# Patient Record
Sex: Female | Born: 1960
Health system: Southern US, Community
[De-identification: ages and names within clinical notes are randomized; demographics above are authoritative.]

## PROBLEM LIST (undated history)

## (undated) DIAGNOSIS — E119 Type 2 diabetes mellitus without complications: Secondary | ICD-10-CM

## (undated) DIAGNOSIS — C679 Malignant neoplasm of bladder, unspecified: Secondary | ICD-10-CM

## (undated) DIAGNOSIS — M1990 Unspecified osteoarthritis, unspecified site: Secondary | ICD-10-CM

## (undated) DIAGNOSIS — I10 Essential (primary) hypertension: Secondary | ICD-10-CM

## (undated) HISTORY — DX: Unspecified osteoarthritis, unspecified site: M19.90

## (undated) HISTORY — PX: ABDOMINAL HYSTERECTOMY: SHX81

## (undated) HISTORY — DX: Type 2 diabetes mellitus without complications: E11.9

## (undated) HISTORY — DX: Malignant neoplasm of bladder, unspecified: C67.9

## (undated) HISTORY — PX: BLADDER SURGERY: SHX569

## (undated) HISTORY — DX: Essential (primary) hypertension: I10

---

## 1997-10-06 ENCOUNTER — Emergency Department (HOSPITAL_COMMUNITY): Admission: EM | Admit: 1997-10-06 | Discharge: 1997-10-06 | Payer: Self-pay | Admitting: Emergency Medicine

## 1997-10-07 ENCOUNTER — Emergency Department (HOSPITAL_COMMUNITY): Admission: EM | Admit: 1997-10-07 | Discharge: 1997-10-07 | Payer: Self-pay | Admitting: Emergency Medicine

## 2000-10-24 ENCOUNTER — Encounter: Admission: RE | Admit: 2000-10-24 | Discharge: 2000-10-24 | Payer: Self-pay | Admitting: Family Medicine

## 2000-10-24 ENCOUNTER — Encounter: Payer: Self-pay | Admitting: Family Medicine

## 2000-12-29 ENCOUNTER — Encounter (INDEPENDENT_AMBULATORY_CARE_PROVIDER_SITE_OTHER): Payer: Self-pay

## 2000-12-29 ENCOUNTER — Ambulatory Visit (HOSPITAL_COMMUNITY): Admission: RE | Admit: 2000-12-29 | Discharge: 2000-12-29 | Payer: Self-pay | Admitting: *Deleted

## 2001-04-09 ENCOUNTER — Emergency Department (HOSPITAL_COMMUNITY): Admission: EM | Admit: 2001-04-09 | Discharge: 2001-04-09 | Payer: Self-pay | Admitting: Emergency Medicine

## 2001-04-09 ENCOUNTER — Encounter: Payer: Self-pay | Admitting: Emergency Medicine

## 2001-07-21 ENCOUNTER — Encounter (INDEPENDENT_AMBULATORY_CARE_PROVIDER_SITE_OTHER): Payer: Self-pay | Admitting: Specialist

## 2001-07-21 ENCOUNTER — Observation Stay (HOSPITAL_COMMUNITY): Admission: RE | Admit: 2001-07-21 | Discharge: 2001-07-22 | Payer: Self-pay | Admitting: *Deleted

## 2002-12-23 ENCOUNTER — Ambulatory Visit (HOSPITAL_COMMUNITY): Admission: RE | Admit: 2002-12-23 | Discharge: 2002-12-23 | Payer: Self-pay | Admitting: Family Medicine

## 2003-05-12 ENCOUNTER — Encounter: Admission: RE | Admit: 2003-05-12 | Discharge: 2003-05-12 | Payer: Self-pay | Admitting: Family Medicine

## 2006-02-10 ENCOUNTER — Emergency Department (HOSPITAL_COMMUNITY): Admission: EM | Admit: 2006-02-10 | Discharge: 2006-02-11 | Payer: Self-pay | Admitting: Emergency Medicine

## 2007-01-11 ENCOUNTER — Emergency Department (HOSPITAL_COMMUNITY): Admission: EM | Admit: 2007-01-11 | Discharge: 2007-01-11 | Payer: Self-pay | Admitting: Emergency Medicine

## 2009-07-18 ENCOUNTER — Encounter (HOSPITAL_BASED_OUTPATIENT_CLINIC_OR_DEPARTMENT_OTHER): Admission: RE | Admit: 2009-07-18 | Discharge: 2009-10-16 | Payer: Self-pay | Admitting: General Surgery

## 2010-03-28 ENCOUNTER — Encounter (HOSPITAL_BASED_OUTPATIENT_CLINIC_OR_DEPARTMENT_OTHER)
Admission: RE | Admit: 2010-03-28 | Discharge: 2010-04-17 | Payer: Self-pay | Source: Home / Self Care | Attending: General Surgery | Admitting: General Surgery

## 2010-04-04 ENCOUNTER — Ambulatory Visit: Admit: 2010-04-04 | Payer: Self-pay | Admitting: Vascular Surgery

## 2010-04-04 ENCOUNTER — Ambulatory Visit
Admission: RE | Admit: 2010-04-04 | Discharge: 2010-04-04 | Payer: Self-pay | Source: Home / Self Care | Attending: Vascular Surgery | Admitting: Vascular Surgery

## 2010-05-14 ENCOUNTER — Encounter (INDEPENDENT_AMBULATORY_CARE_PROVIDER_SITE_OTHER): Payer: Medicare HMO | Admitting: Vascular Surgery

## 2010-05-14 DIAGNOSIS — I83229 Varicose veins of left lower extremity with both ulcer of unspecified site and inflammation: Secondary | ICD-10-CM

## 2010-05-14 DIAGNOSIS — L97919 Non-pressure chronic ulcer of unspecified part of right lower leg with unspecified severity: Secondary | ICD-10-CM

## 2010-05-15 NOTE — Consult Note (Signed)
NEW PATIENT CONSULTATION  [name], [name] DOB:  08-28-1960                                       05/14/2010 CHART#:[id number]  This patient is a 50 year old female with a history of venous insufficiency and stasis ulcer in the left leg for the past 6 months and symptoms of venous insufficiency in the right leg for the last several months.  She has had some bulging varicosities in both legs for many years and 6 months ago developed spontaneously an ulcer in the medial aspect of the left ankle.  She was treated at [facility] and has worn short-leg elastic compression stockings (20 mm - 30 mm gradient) prescribed by Dr. Leonie Man.  She eventually quit going to the wound center treated and treated the ulcer herself and it healed a few months ago.  She has had darkening and scaliness of the skin in the left ankle for the last few years as well as chronic edema with left leg worse than the right but has cramping and aching discomfort in both the legs as the day progresses with burning and itching.  She has no history of thrombophlebitis, deep vein thrombosis, bleeding or other complications other than the stasis ulcer.  She has had increasing discomfort, however.  She does not elevate her legs or take pain medicine on a regular basis.  CHRONIC MEDICAL PROBLEMS: 1. Hyperlipidemia. 2. Negative for diabetes, hypertension, coronary artery disease, COPD     or stroke.  SOCIAL HISTORY:  She is married and has one child.  She is a Water quality scientist.  She has not smoked since 2003.  Does not use alcohol.  FAMILY HISTORY:  Positive for stroke in her mother and negative for coronary artery disease or diabetes.  REVIEW OF SYSTEMS:  Positive for spasms in her legs, but no claudication.  Denies all other systems in a complete  review of systems.  PHYSICAL EXAMINATION:  Blood pressure 143/90, heart rate 83, respirations 18.  General:  She is a well-developed,  well-nourished female in no apparent distress, alert and oriented x3.  HEENT:  Exam is normal for age.  EOMs intact.  Lungs:  Clear to auscultation.  No rhonchi or wheezing.  Cardiovascular:  Regular rhythm.  No murmurs. Carotid pulses 3+.  No audible bruits.  Abdomen:  Soft, nontender with no masses.  Musculoskeletal:  Exam is free of major deformities. Neurologic:  Exam is normal.  Lower extremity exam:  Reveals 3+ femoral and posterior tibial pulses bilaterally.  There are bulging varicosities below the knee in both great saphenous systems, left worse than the right.  There is 1+ edema bilaterally.  Left ankle has hyperpigmentation with lipodermatosclerosis particularly medially with a healed stasis ulcer with the scar  measuring about 1.5 in diameter.  No active ulcers noted in the right leg.  I reviewed her venous duplex exam which was performed in our office on 04/04/2010 which reveals severe reflux in the left great saphenous system from the saphenofemoral junction to the knee with no evidence of DVT.  Right leg has incompetence of the great saphenous vein below the knee but not in the thigh or at the junction.  I think this patient does need laser ablation of her left great saphenous vein to treat the stasis ulcer and skin changes which she has developed and may well develop problems in the  contralateral right leg as time progresses.  She is having symptoms in the right leg.  We will treat her with long-leg elastic compression stockings (20 mm - 30 mm gradient) as well as elevation and ibuprofen.  She will return in 3 months and at that time I think we should consider scheduling her for laser ablation of the left great saphenous vein.    [name], M.D. Electronically Signed  JDL/MEDQ  D:  05/14/2010  T:  05/15/2010  Job:  [id number]

## 2010-07-31 NOTE — Procedures (Signed)
LOWER EXTREMITY VENOUS REFLUX EXAM   INDICATION:  Left lower extremity ulcer.   EXAM:  Using color-flow imaging and pulse Doppler spectral analysis, the  bilateral common femoral, superficial femoral, popliteal, posterior  tibial, greater and lesser saphenous veins are evaluated.  There is  evidence suggesting physiologic deep venous insufficiency in the common  femoral vein of the lower extremity.   The left saphenofemoral junction is not competent with Reflux of  >512milliseconds. The left GSV is not competent throughout its course  with reflux of >566milliseconds with the caliber as described below.  The right GSV is not competent below the knee.   The bilateral proximal short saphenous veins demonstrate competency.   GSV Diameter (used if found to be incompetent only)                                            Right         Left  Proximal Greater Saphenous Vein           0.64 cm       0.60 cm  Proximal-to-mid-thigh                     cm            0.68 cm  Mid thigh                                 cm            0.63 cm  Mid-distal thigh                          cm            cm  Distal thigh                              cm            0.86 cm  Knee                                      cm            0.50 cm   IMPRESSION:  1. The left greater saphenous vein is not competent throughout its      course with branches and varicose vein nests in the calf and ankle.  2. The right greater saphenous vein is not competent in the calf.  3. The bilateral greater saphenous veins are not tortuous.  4. The deep venous system is competent.  5. The bilateral short saphenous vein is competent.  6. No evidence of deep venous or superficial femoral thrombus in the      bilateral lower extremities.   ___________________________________________  [name], M.D.   LT/MEDQ  D:  04/04/2010  T:  04/04/2010  Job:  [id number]

## 2010-08-03 NOTE — H&P (Signed)
[facility]  PatientMERCI, [name] Visit Number: [id number] MRN: [id number]          Service Type: Attending:  Marina Gravel, M.D. Dictated by:   Marina Gravel, M.D. Adm. Date:  12/29/00                           History and Physical  SOCIAL SECURITY NUMBER:       [id number]  DATE OF BIRTH:                March 09, 1961  REFERRING PHYSICIAN:          Redmond Baseman, M.D.  PREOPERATIVE DIAGNOSIS:       Complex left ovarian cyst.  INTENDED PROCEDURE:           Diagnostic laparoscopy, left salpingo-oophorectomy versus left ovarian cystectomy.  HISTORY OF PRESENT ILLNESS:   Patient is a 50 year old [place] female gravida 1 para 1 seen at the request of Dr. Leodis Sias for evaluation of vaginal pressure.  Subsequent ultrasound showed a left complex ovarian cyst.  This was initially noted in August.  Repeated one month later and persistent cyst noted.  The ultrasound appearance is of a ground-glass-type appearance consistent with endometrioma.  Patient with history of difficulty achieving pregnancy.  She presents for definitive diagnosis and treatment.  PAST MEDICAL HISTORY:         None.  PAST SURGICAL HISTORY:        None.  PAST OBSTETRICAL HISTORY:     Spontaneous vaginal delivery x 1.  MEDICATIONS:                  None.  ALLERGIES:                    None.  SOCIAL HISTORY:               One pack-a-day smoker.  No alcohol or other drugs.  FAMILY HISTORY:               Negative.  REVIEW OF SYSTEMS:            CONSTITUTIONAL:  No unexplained weight change, fever, dizzy spells, or fainting.  EYES:  No trouble with vision.  ENT:  No trouble with ears, hearing, nose bleeds, or sinuses.  CARDIOVASCULAR:  No chest pain or shortness of breath.  RESPIRATORY:  No cough.  GASTROINTESTINAL: No nausea or vomiting, blood in stools, heartburn, indigestion, or constipation.  GENITOURINARY:  No abnormal bleeding, vaginal  discharge, leakage of urine, or painful urination.  MUSCULOSKELETAL:  No joint or muscle pain.  SKIN AND BREAST:  No lesions.  NEUROLOGIC:  No headaches or dizzy spells.  PSYCHIATRIC:  No work or family problems, history of domestic violence or sexual assault.  ENDOCRINE:  No hot flashes, thyroid disease, or diabetes.  HEMATOLOGIC:  No unexplained bruising or bleeding.  PHYSICAL EXAMINATION:  VITAL SIGNS:                  Blood pressure 120/76, weight 225, height 5 feet 6 inches.  GENERAL:                      Patient is a well-developed, mildly obese female in no acute distress.  NECK:                         Supple,  no thyromegaly.  LUNGS:                        Clear to auscultation.  HEART:                        Regular rate and rhythm.  ABDOMEN:                      Liver and spleen normal, no hernia.  No mass palpable.  LYMPHATIC:                    Lymph node survey negative of neck, axilla, and groin.  SKIN:                         No lesions.  BREAST:                       No dominant breast mass, nipple discharge, or adenopathy.  PEVIC/RECTAL:                 Normal external female genitalia, vagina and cervix normal.  Pap smear performed and was normal August 2002.  Uterus normal size, difficult to palpate due to body habitus.  Adnexal mass difficult to palpate due to body habitus.  Urethra, bladder base, anus, perineum normal. Rectovaginal exam confirms.  Heme negative.  LABORATORY DATA:              Ultrasound shows a left ovarian 5 cm cyst with a ground-glass appearance.  No increased blood flow with colored Doppler.   In my opinion appears consistent with an endometrioma.  Also noted incidentally are two fibroids - one anterior and one posterior, both approximately 1.5 cm in maximum diameter.  ASSESSMENT:                   Persistent complex left ovarian cyst, probable endometrioma by ultrasound appearance.  PLAN:                         Diagnostic  laparoscopy, open technique. Probable left ovarian cystectomy; however, may need left salpingo-oophorectomy.  Discussed with the patient potential need for frozen section analysis and staging if indicated.  Operative risks discussed including infection; bleeding; damage to bowel, bladder, or surrounding organs. All questions answered.  Patient wishes to proceed.  Arrangements have been made for December 29, 2000 at [facility].  A bowel prep will be given preoperatively. Dictated by:   Marina Gravel, M.D. Attending:  Marina Gravel, M.D. DD:  12/18/00 TD:  12/18/00 Job: [id number]

## 2010-08-03 NOTE — Discharge Summary (Signed)
[facility]  PatientTHANVI, [name] Visit Number: [id number] MRN: [id number]          Service Type: DSU Location: 9300 [id number] Attending Physician:  Ermalene Searing Dictated by:   Marina Gravel, M.D. Admit Date:  07/21/2001 Discharge Date: 07/22/2001                             Discharge Summary  PREOPERATIVE DIAGNOSES:       1. Menorrhagia.                               2. Uterine fibroids.                               3. Endometriosis.  POSTOPERATIVE DIAGNOSES:      1. Menorrhagia.                               2. Uterine fibroids.                               3. Endometriosis.  PROCEDURES:                   Laparoscopic-assisted vaginal hysterectomy with bilateral salpingo-oophorectomy.  HISTORY OF PRESENT ILLNESS:   For complete details, please see the H&P in the chart.  Briefly, the patient is a 50 year old [place] female, gravida 1, para 1, who presents for definitive surgical management of the above symptoms.  HOSPITAL COURSE:              On day of admission, the patient underwent LAVH and BSO without complication.  Findings at time of surgery included endometriosis on left pelvic sidewall (about 0.5 cm implant directly over the course of left ureter which was left untreated given its location).  Also slightly enlarged uterus consistent with fibroids and findings on left ovary consistent with endometriosis.  Postoperatively, the patient rapidly regained the ability to ambulate, void, and tolerate a regular diet.  She was discharged home on the first postoperative day in satisfactory condition.  DISCHARGE INSTRUCTIONS:       1. No heavy lifting and nothing in the                                  vagina for six weeks.  No driving for two                                  weeks.                               2. Notify with fever, increasing vaginal                                  bleeding or discharge, pain, or other                        concerns.  DISCHARGE MEDICATIONS:  1. Climara patch 0.05 mg daily, change weekly.                               2. Tylox 1 to 2 p.o. q.4-6h. p.r.n. pain, #30,                                  no refill.  [facility] Dr. Earlene Plater, in four weeks.  CONDITION UPON DISCHARGE:     Satisfactory. Dictated by:   Marina Gravel, M.D. Attending Physician:  Marina Gravel B DD:  07/22/01 TD:  07/24/01 Job: [id number]

## 2010-08-03 NOTE — Op Note (Signed)
[facility]  PatientJENYA, [name] Visit Number: [id number] MRN: [id number]          Service Type: DSU Location: 9300 [id number] Attending Physician:  Ermalene Searing Dictated by:   Marina Gravel, M.D. Proc. Date: 07/21/01 Admit Date:  07/21/2001                             Operative Report  PREOPERATIVE DIAGNOSIS:       Menorrhagia, uterine fibroids, endometriosis.  POSTOPERATIVE DIAGNOSIS:      Menorrhagia, uterine fibroids, endometriosis.  OPERATION:                    LAVH/BSO.  SURGEON:                      Marina Gravel, M.D.  ASSISTANT:                    Lenoard Aden, M.D.  ANESTHESIA:                   General.  FINDINGS:                     Endometriosis, left pelvic side wall.  Normal appearing tubes and ovaries.  Small uterine fibroids noted.  ESTIMATED BLOOD LOSS:         300 cc.  URINE:                        400 cc.  FLUID:                        1700 cc.  DRAINS:                       Foley, vaginal pack in the vagina.  INDICATIONS:                  Patient with a history of menorrhagia not responding to medical management.  Ultrasound suggested uterine fibroids and preoperative endometrial biopsy showed secretory endometrium.  Also with a known history of endometriosis and desires definitive therapy in this regard as well.  DESCRIPTION OF PROCEDURE:     The patient taken to the operating room, given general anesthesia.  She was placed in the ski position and examined under anesthesia.  She was found to have a midposition slightly enlarged uterus and adnexal masses.  She was prepped and draped in the standard fashion and a Foley catheter was inserted into the bladder.  Attention was then turned to the umbilicus.  A 10 mm vertical infraumbilical skin fold incision was made with the knife.  This was carried sharply to the underlying fascia.  The fascia was divided sharply with the knife and elevated with Kocher  clamps.  The posterior sheath and peritoneum were then divided sharply.  A purse string suture of 0 Vicryl was placed around the facial defect, Hasson cannula inserted and secured.  Pneumoperitoneum was obtained with CO2 gas.  The patient was placed in Trendelenburg position.  The inferior ports were placed 8 cm off the midline, 2 cm above the symphysis.  Each was a 5 mm port and each was placed under direct laparoscopic visualization.  The pelvis was inspected with the above findings noted.  The uterus was placed on traction and deviated to  the patients right, and the left tube and ovary grasped and elevated.  The course of the ureter was identified and found to be well away from IP ligament.  Subsequently, the IP ligament was triple burned with bipolar cautery and divided.  The peritoneum between the tube and round ligament was serially cauterized and divided with bipolar cautery.  The round ligament was then cauterized in triple burn fashion with bipolar cautery and divided sharply.  This procedure was repeated entirely on the opposite side in similar fashion.  There was an endometriotic implant directly over the course of the ureter.  It was about 5 mm in diameter.  Given its direct location over the ureter it was left untreated. The bladder flap was then created with sharp and blunt technique.  Attention was turned to the vagina after pneumoperitoneum was released.  A wedge speculum was placed in the vagina and the cervix grasped anteriorly and posteriorly with Loman [place].  The posterior cul-de-sac was entered sharply.  The uterosacral ligaments were clamped on each side with curved Haney clamp, divided sharply, and suture ligated with 0 Vicryl, with hemostasis obtained.  The remained of the vagina was circumscribed with Bovie cautery and the anterior cul-de-sac entered sharply.  A curved retractor was placed into the anterior cul-de-sac and a long wedge speculum placed into  the posterior cul-de-sac.  The cardinal ligaments were then serially clamped with the bipolar clamping device and cauterized, and subsequently divided sharply. This was repeated on each side in a similar fashion to the level of the dissection from above.  This allowed removal of the uterus.  Prior to the last clamp, the uterus was circumscribed with a Bovie about 50% of its thickness into the myometrium to allow decompression of the uterine fibroids and easier passage of the uterus into the vagina and to allow for the final clamp on each side, which was subsequently clamped, cauterized, and divided sharply.  A tipped sponge was placed into the abdomen vaginally to protect the colon and the vagina closed with interrupted figure-of-eight suture of 0 Vicryl.  The sponge was removed prior to the final suture.  Also prior to the final suture a McCall culdoplasty suture was placed with 0 Vicryl and fixed to both uterosacral ligaments.  The vaginal cuff was also fixed to the uterosacral ligament with the closing suture at that level.  After the vagina was closed, the McCall suture was cinched down and this provided good obliteration of the posterior cul-de-sac.  A vaginal pack was placed and gloves changed, and attention returned to the abdomen.  Pneumoperitoneum was reobtained and the pelvis inspected.  There were a few bleeders along the vaginal cuff that were made hemostatic with the bipolar cautery.  The pelvis was irrigated, reinspected, and hemostasis found.  Therefore, the procedure was terminated.  The inferior ports were removed under direct laparoscopic visualization and the sites were hemostatic.  The scope was removed, Hasson cannula removed, gas released, and I inserted my index finger through the fascial defect and stitched down the pursestring suture.  This obliterated the fascial defect and prevented herniation of abdominal contents due to defect prior to closure.  Deep sutures  were placed of 2-0 Vicryl in the subcutaneous tissue at the  umbilical incision, and the skin each site closed with Dermabond.  The patient tolerated the procedure well and there were no complications.  She was taken to the recovery room awake and alert in stable condition.  All counts were correct. Dictated  by:   Marina Gravel, M.D. Attending Physician:  Marina Gravel B DD:  07/21/01 TD:  07/21/01 Job: [id number]

## 2010-08-03 NOTE — H&P (Signed)
[facility]  PatientKEAMBER, MACFADDEN Visit Number: [id number] MRN: [id number]          Service Type: DSU Location: 9300 [id number] Attending Physician:  Ermalene Searing Dictated by:   Marina Gravel, M.D. Admit Date:  07/21/2001                           History and Physical  CHIEF COMPLAINT:              Abnormal uterine bleeding not responding to                               medical management, uterine fibroids,                               endometriosis.  INTENDED PROCEDURE:           Open laparoscopy with laparoscopically assisted                               vaginal hysterectomy, bilateral                               salpingo-oophorectomy.  HISTORY OF PRESENT ILLNESS:   This 50 year old [place] female, gravida 1 para 1, presents for definite surgical management of menorrhagia and uterine fibroids, also with a history of endometriosis with associated pelvic pain. Recent endometrial biopsy has shown benign secretory endometrium.  Bleeding has not responded to high-dose ______.  The patient is not a birth control pill candidate as she smokes.  PAST MEDICAL HISTORY:         1. Upper laparoscopy.                               2. Left ovarian cystectomy for endometrioma.                                  Small amount of endometriosis otherwise                                  noted throughout the pelvis.   MEDICATIONS:                  None.  ALLERGIES:                    None.  SOCIAL HISTORY:               One-pack-a-day smoker.  No alcohol or other drugs.  FAMILY HISTORY:               Noncontributory.  REVIEW OF SYSTEMS:            Otherwise negative.  PHYSICAL EXAMINATION:  VITAL SIGNS:                  Blood pressure 108/70.  Weight 242 pounds. Pulse 84.  GENERAL:                      Alert and oriented, in no  acute distress.  SKIN:                         Warm and dry, no lesions.  HEART:                        Regular  rate and rhythm.  LUNGS:                        Clear to auscultation.  ABDOMEN:                      Obese.  Liver and spleen normal.  No hernia. Umbilical incision from previous laparoscopy noted.  PELVIC:                       Normal external genitalia.  Vagina normal. Cervix parous.  Uterus slightly enlarged consistent with small fibroids; however, it is mobile with Valsalva.  LABORATORY DATA:              Recent ultrasound shows two 2 cm fibroids, otherwise no abnormalities.  ASSESSMENT:                   1. Abnormal bleeding, primarily menorrhagia, not                                  responding to medical management.  Benign                                  preoperative biopsy and small fibroids on                                  ultrasound.                               2. History of endometriosis with previous                                  surgical treatment for endometrioma.  PLAN:                         Laparoscopically assisted vaginal hysterectomy and bilateral salpingo-oophorectomy.  Operative risks were discussed including infection, bleeding, damage to bowel or bladder or surrounding organs.  All questions were answered and the patient wished to proceed. Dictated by:   Marina Gravel, M.D. Attending Physician:  Marina Gravel B DD:  07/20/01 TD:  07/21/01 Job: [id number]

## 2010-08-03 NOTE — Op Note (Signed)
[facility]  PatientELY, SPRAGG Visit Number: [id number] MRN: [id number]          Service Type: DSU Location: [facility] Attending Physician:  Ermalene Searing Dictated by:   Marina Gravel, M.D. Proc. Date: 12/29/00 Admit Date:  12/29/2000                             Operative Report  PREOPERATIVE DIAGNOSES:       Persistent complex left ovarian cyst, probable endometrioma.  POSTOPERATIVE DIAGNOSES:      Left endometrioma.  PROCEDURE:                    Open laparoscopy, left ovarian cystectomy, placement of ______ in the pelvis after procedure.  SURGEON:                      Marina Gravel, M.D.  ASSISTANT:                    Sung Amabile. Roslyn Smiling, M.D.  ANESTHESIA:                   General.  FINDINGS:                     Left endometrioma, superficial ("brown spotting") endometriosis lesions of the bladder flap, cul-de-sac, and right ovary.  SPECIMENS:                    Left ovarian cyst.  ESTIMATED BLOOD LOSS:         50 cc.  COMPLICATIONS:                None.  DISPOSITION:                  Recovery room, stable.  INDICATIONS:                  Patient with persistent left ovarian cyst and a sensation of pelvic fullness, history of difficulty achieving pregnancy. Patient presents for definitive diagnosis and treatment.  PROCEDURE:                    Patient was taken to the operating room and general anesthesia obtained.  She was placed in the ski position and examined under anesthesia.  She was noted to have a left adnexal mass palpable.  No other abnormalities were noted.  She was then prepped and draped in the standard fashion and a Foley catheter inserted into the bladder.  Speculum was inserted into the vagina and the anterior lip of the cervix grasped with a tooth tenaculum.  A Hulka tenaculum was then inserted into the uterine cavity and attached to the anterior lip of the cervix.  All other instruments were then removed from  the vagina.  Attention was then turned to the abdomen.  A 10 mm vertical infraumbilical skin fold incision was made with a knife.  Subcutaneous tissue was then dissected away bluntly and the underlying fascia was divided sharply with the knife.  The fascia was then elevated with Kocher clamps and the posterior sheath and peritoneum were incised sharply with the knife.  A purse string suture was then placed with 0 Vicryl around the fascial defect. Hasson cannula was then inserted through the defect and secured with a purse string suture.  Intra-abdominal placement was confirmed with the laparoscope. Pneumoperitoneum  obtained with CO2 gas.  Patient was placed in Trendelenburg position and the pelvis inspected with the above findings noted.  Ancillary trocars were inserted in the left lower quadrant and right lower quadrant and midline.  Each was a 5 mm trocar.  Each was placed approximately 2 cm above the symphysis and each was placed under direct laparoscopic visualization.  The left ovarian cyst was then mobilized away from the pelvic side wall where it was adhesed.  Also, filmy adhesions were dissected sharply away from the sigmoid colon.  During mobilization of the cyst the cyst wall ruptured and chocolate fluid was released.  The pelvis was irrigated and the cyst again inspected.  The cyst wall was then further opened with the hook scissors.  The cyst wall was then grasped and dissected away bluntly from the ovary.  The ovarian cyst wall was sent as a specimen to pathology.  The bed of the previous ovarian cyst and the cut edge of the ovary were then made hemostatic with bipolar cautery.  Hemostasis was obtained.  The remainder of the endometriosis lesions were diffuse and very superficial.  Therefore, I made the decision that further treatment would not be of benefit at this time. It is my opinion the patient may be a good candidate for Depo-Lupron should she have recurrent  pelvic pain.  The left lower quadrant trocar was removed and the site was hemostatic.  The ______ was then placed through the incision by placing the introducer tube through the previous left lower quadrant incision.  The pelvis was then filled with one container of ______ for reduction of postoperative adhesion formation.  The other inferior ports were then removed under laparoscopic visualization. Each site was hemostatic.  The scope was removed and gas released.  Hasson cannula was removed and I inserted an index finger through the fascial defect and elevated the abdominal wall.  As I cinched down the purse string suture I removed the index finger.  The fascial defect was completely closed and no intra-abdominal contents herniated through the defect.  Each skin incision was then closed with subcuticular 4-0 Vicryl.  The patient tolerated procedure well and there were no complications.  She was taken to the recovery room awake, alert, and in stable condition.  All counts were correct per the operating room staff.Dictated by:   Marina Gravel, M.D.  Attending Physician:  Marina Gravel B DD:  12/29/00 TD:  12/29/00 Job: [id number]

## 2010-08-27 ENCOUNTER — Ambulatory Visit (INDEPENDENT_AMBULATORY_CARE_PROVIDER_SITE_OTHER): Payer: Medicare HMO | Admitting: Vascular Surgery

## 2010-08-27 DIAGNOSIS — I83893 Varicose veins of bilateral lower extremities with other complications: Secondary | ICD-10-CM

## 2010-08-28 NOTE — Assessment & Plan Note (Signed)
OFFICE VISIT  [name], [name] DOB:  Feb 06, 1961                                       08/27/2010 [id number]#:[id number]  The patient returns today for continued followup regarding her severe venous insufficiency of the left lower extremity with a history of venous stasis ulcer which healed about 6 months ago.  She has been having aching, throbbing, burning and cramping discomfort as well as itching around the area where the ulcer healed.  She has worn long leg pressure stockings (20-30 mm gradient) and has tried elevation and ibuprofen over the last 3 months.  She has not had a recurrence of the ulcer but continues to have severe symptoms with chronic swelling in the left leg despite this treatment.  It is definitely effecting her daily living at this time.  PHYSICAL EXAMINATION:  Vital signs:  Today blood pressure is 148/78, heart rate is 93, respirations 16.  General:  She is a well-developed, well-nourished female in no apparent distress, alert and oriented x3. HEENT:  Normal for age.  EOMs intact.  Lungs:  Clear to auscultation. No rhonchi or wheezing.  Lower extremities:  Exam reveals 3+ femoral, popliteal and dorsalis pedis pulse bilaterally.  Left leg has chronic 1+ edema with some early hyperpigmentation.  There is evidence of a healed ulcer adjacent to the medial malleolus on the left ankle with very dark crusty skin surrounding this over a 3-4 cm circumference with a punctate healed ulcer in the center.  She has 3+ arterial pulses distally.  She also has a few bulging varicosities over the great saphenous system in the calf area.  She does have documented gross reflux in the left great saphenous system from the saphenofemoral junction to the knee causing her venous hypertension.  I think the best plan would be to proceed with laser ablation of left great saphenous vein which we will perform in the near future following precertification from her  insurance company.    [name], M.D. Electronically Signed  JDL/MEDQ  D:  08/27/2010  T:  08/28/2010  Job:  [id number]

## 2010-09-17 ENCOUNTER — Other Ambulatory Visit (INDEPENDENT_AMBULATORY_CARE_PROVIDER_SITE_OTHER): Payer: 59 | Admitting: Vascular Surgery

## 2010-09-17 DIAGNOSIS — I83893 Varicose veins of bilateral lower extremities with other complications: Secondary | ICD-10-CM

## 2010-09-18 NOTE — Assessment & Plan Note (Signed)
OFFICE VISIT  CHRISA, HASSAN DOB:  06-26-1960                                       09/17/2010 [id number]#:[id number]  The patient had laser ablation of her left great saphenous vein done under local tumescent anesthesia for severe venous hypertension in the left great saphenous system secondary to valvular incompetence with resultant history of stasis ulcer in the left ankle.  She tolerated the procedure well.  Will return in 1 week for followup venous duplex exam to confirm closure.    [name], M.D. Electronically Signed  JDL/MEDQ  D:  09/17/2010  T:  09/18/2010  Job:  [id number]

## 2010-09-25 ENCOUNTER — Ambulatory Visit (INDEPENDENT_AMBULATORY_CARE_PROVIDER_SITE_OTHER): Payer: 59 | Admitting: Vascular Surgery

## 2010-09-25 ENCOUNTER — Encounter (INDEPENDENT_AMBULATORY_CARE_PROVIDER_SITE_OTHER): Payer: 59

## 2010-09-25 DIAGNOSIS — M79609 Pain in unspecified limb: Secondary | ICD-10-CM

## 2010-09-25 DIAGNOSIS — Z48812 Encounter for surgical aftercare following surgery on the circulatory system: Secondary | ICD-10-CM

## 2010-09-25 DIAGNOSIS — I83893 Varicose veins of bilateral lower extremities with other complications: Secondary | ICD-10-CM

## 2010-09-25 NOTE — Assessment & Plan Note (Signed)
OFFICE VISIT  [name], [name] DOB:  06-12-1960                                       09/25/2010 [id number]#:[id number]  Patient had laser ablation of her left great saphenous vein for venous hypertension and history of venous stasis ulcer secondary to valvular incompetence of the left great saphenous vein.  She tolerated the procedure well and had a moderate amount of discomfort from the knee to the groin overlying the saphenous vein with some swelling around the vein.  She has had no distal edema and no pain at the entrance site in the left calf.  She denies any chest pain, dyspnea on exertion, hemoptysis, or other symptoms since the procedure.  CHRONIC MEDICAL PROBLEMS WHICH ARE STABLE: 1. Hyperlipidemia. 2. Negative for diabetes, hypertension, coronary artery disease, COPD     or stroke.  PHYSICAL EXAMINATION:  Blood pressure is 141/76, heart rate 88, respirations 24.  Generally, a well-developed, well-nourished female in no apparent distress.  Lungs:  Clear to auscultation.  No rhonchi or wheezing.  Lower extremity exam reveals mild to moderate tenderness over the great saphenous vein from the proximal calf to the saphenofemoral junction with some mild erythema.  I am able to straighten the leg completely, and she has 3+ dorsalis pedis pulse.  No distal edema.  No active ulcers.  Today I ordered a venous duplex exam which I have reviewed and interpreted.  There is no DVT, and the left great saphenous vein is totally ablated from the entrance site in the proximal calf to the saphenofemoral junction.  I reassured her regarding these findings.  Suggested she wear the long stocking 1 more week and then convert to a left leg short stocking on a chronic basis because of her skin changes.  She does have varicosities in the great saphenous system below the knee on the right side but no reflux from the junction on the right down to the knee.  If these  should progress and become symptomatic with time, she will be in touch with [place] to repeat a duplex exam of the right leg.    [name], M.D. Electronically Signed  JDL/MEDQ  D:  09/25/2010  T:  09/25/2010  Job:  [id number]

## 2010-10-03 NOTE — Procedures (Unsigned)
DUPLEX DEEP VENOUS EXAM - LOWER EXTREMITY  INDICATION:  Left lower extremity pain and followup of endovenous laser ablation.  HISTORY:  Edema:  Yes. Trauma/Surgery:  Endovenous laser ablation on the left lower extremity 09/17/2010. Pain:  Yes. PE:  No. Previous DVT:  No. Anticoagulants:  No. Other:  Left medial malleolus healing ulcer.  DUPLEX EXAM:               CFV   SFV   PopV  PTV    GSV               R  L  R  L  R  L  R   L  R  L Thrombosis    o  o     o     o      o     + Spontaneous   +  +     +     +      +     o Phasic        +  +     +     +      +     o Augmentation  +  +     +     +      +     o Compressible  +  +     +     +      +     o Competent     +  +     +     +            o  Legend:  + - yes  o - no  p - partial  D - decreased  IMPRESSION: 1. No evidence of deep venous thrombosis identified in the left lower     extremity. 2. Good post ablation result the length of the left GSV ablated. 3. No extension of thrombus identified at the left saphenofemoral     junction. 4. Patent contralateral common femoral vein with normal spontaneous     and phasic flow.    _____________________________ [name], M.D.  SH/MEDQ  D:  09/25/2010  T:  09/25/2010  Job:  [id number]

## 2010-11-06 ENCOUNTER — Other Ambulatory Visit: Payer: Self-pay | Admitting: Infectious Diseases

## 2010-11-06 ENCOUNTER — Ambulatory Visit
Admission: RE | Admit: 2010-11-06 | Discharge: 2010-11-06 | Disposition: A | Discharge status: Home / Self Care | Payer: No Typology Code available for payment source | Source: Ambulatory Visit | Attending: Infectious Diseases | Admitting: Infectious Diseases

## 2010-11-06 DIAGNOSIS — R7611 Nonspecific reaction to tuberculin skin test without active tuberculosis: Secondary | ICD-10-CM

## 2010-12-26 LAB — CBC
HCT: 42.9
Hemoglobin: 15
MCHC: 35.1
MCV: 89.1
Platelets: 196
RBC: 4.82
RDW: 12.1
WBC: 10.5

## 2010-12-26 LAB — POCT CARDIAC MARKERS
Operator id: 4531
Troponin i, poc: <0.05

## 2010-12-26 LAB — DIFFERENTIAL
Basophils Absolute: 0
Basophils Relative: 0
Eosinophils Absolute: 0.1
Eosinophils Relative: 1
Lymphocytes Relative: 22
Lymphs Abs: 2.3
Monocytes Absolute: 0.5
Monocytes Relative: 5
Neutro Abs: 7.6
Neutrophils Relative %: 72

## 2010-12-26 LAB — D-DIMER, QUANTITATIVE: D-Dimer, Quant: 0.23

## 2016-11-19 NOTE — H&P (Signed)
Urology Preoperative H&P   Chief Complain: Gross hematuria  History of Present Illness: [name] is a 56 y.o.  female who presents today with a total history of intermittent episodes of gross hematuria/vaginal bleeding associated with back pain. She is a nonsmoker and denies a prior history of nephrolithiasis, urinary tract infections or prior urologic surgeries. She has no personal/family history of GU malignancies. She denies a history of voiding or storage urinary symptoms, hematuria, UTIs, STDs, urolithiasis, GU malignancy/trauma/surgery.  No past medical history on file.  Surgical History: Hysterectomy in 2006  Allergies: NKDA No family history of GU malignancies  Social History: denies tobacco,alcohol or illicit drug use ROS: A complete review of systems was performed.  All systems are negative except for pertinent findings as noted.  Physical Exam:  Vital signs in last 24 hours:   Constitutional:  Alert and oriented, No acute distress Cardiovascular: Regular rate and rhythm, No JVD Respiratory: Normal respiratory effort, Lungs clear bilaterally GI: Abdomen is soft, nontender, nondistended, no abdominal masses GU: No CVA tenderness Lymphatic: No lymphadenopathy Neurologic: Grossly intact, no focal deficits Psychiatric: Normal mood and affect  Laboratory Data:  No results for input(s): WBC, HGB, HCT, PLT in the last 72 hours.  No results for input(s): NA, K, CL, GLUCOSE, BUN, CALCIUM, CREATININE in the last 72 hours.  Invalid input(s): CO3   No results found for this or any previous visit (from the past 24 hour(s)). No results found for this or any previous visit (from the past 240 hour(s)).  Renal Function: No results for input(s): CREATININE in the last 168 hours. CrCl cannot be calculated (No order found.).  Radiologic Imaging: No results found.  I independently reviewed the above imaging studies.  Assessment and Plan [name] is a 56 y.o. female  with a bladder lesion concerning for malignancy.  She is here today for cystoscopy with TURBT.  The risks, benefits and alternatives of the above procedure was discussed with the patient.  She voices understanding and wishes to proceed.   [name]  11/19/2016, 12:57 PM

## 2016-11-21 DIAGNOSIS — C672 Malignant neoplasm of lateral wall of bladder: Secondary | ICD-10-CM | POA: Insufficient documentation

## 2016-11-29 ENCOUNTER — Encounter (HOSPITAL_BASED_OUTPATIENT_CLINIC_OR_DEPARTMENT_OTHER): Admission: RE | Payer: Self-pay | Source: Ambulatory Visit

## 2016-11-29 ENCOUNTER — Ambulatory Visit (HOSPITAL_BASED_OUTPATIENT_CLINIC_OR_DEPARTMENT_OTHER): Admission: RE | Admit: 2016-11-29 | Payer: PRIVATE HEALTH INSURANCE | Source: Ambulatory Visit | Admitting: Urology

## 2016-11-29 SURGERY — TURBT (TRANSURETHRAL RESECTION OF BLADDER TUMOR)
Anesthesia: General | Laterality: Bilateral

## 2018-03-31 ENCOUNTER — Other Ambulatory Visit: Payer: Self-pay

## 2018-03-31 ENCOUNTER — Encounter (HOSPITAL_COMMUNITY): Payer: Self-pay | Admitting: Emergency Medicine

## 2018-03-31 ENCOUNTER — Emergency Department (HOSPITAL_COMMUNITY): Payer: 59

## 2018-03-31 ENCOUNTER — Emergency Department (HOSPITAL_COMMUNITY)
Admission: EM | Admit: 2018-03-31 | Discharge: 2018-03-31 | Disposition: A | Discharge status: Home / Self Care | Payer: 59 | Attending: Emergency Medicine | Admitting: Emergency Medicine

## 2018-03-31 DIAGNOSIS — F17210 Nicotine dependence, cigarettes, uncomplicated: Secondary | ICD-10-CM | POA: Insufficient documentation

## 2018-03-31 DIAGNOSIS — J90 Pleural effusion, not elsewhere classified: Secondary | ICD-10-CM | POA: Diagnosis not present

## 2018-03-31 DIAGNOSIS — Z8551 Personal history of malignant neoplasm of bladder: Secondary | ICD-10-CM | POA: Insufficient documentation

## 2018-03-31 DIAGNOSIS — R1011 Right upper quadrant pain: Secondary | ICD-10-CM | POA: Insufficient documentation

## 2018-03-31 DIAGNOSIS — K760 Fatty (change of) liver, not elsewhere classified: Secondary | ICD-10-CM | POA: Diagnosis not present

## 2018-03-31 DIAGNOSIS — R0602 Shortness of breath: Secondary | ICD-10-CM | POA: Diagnosis not present

## 2018-03-31 DIAGNOSIS — K439 Ventral hernia without obstruction or gangrene: Secondary | ICD-10-CM | POA: Diagnosis not present

## 2018-03-31 LAB — I-STAT TROPONIN, ED: TROPONIN I, POC: 0 ng/mL (ref 0.00–0.08)

## 2018-03-31 LAB — COMPREHENSIVE METABOLIC PANEL
ALT: 29 U/L (ref 0–44)
ANION GAP: 10 (ref 5–15)
AST: 36 U/L (ref 15–41)
Albumin: 4.1 g/dL (ref 3.5–5.0)
Alkaline Phosphatase: 79 U/L (ref 38–126)
BUN: 14 mg/dL (ref 6–20)
CHLORIDE: 104 mmol/L (ref 98–111)
CO2: 26 mmol/L (ref 22–32)
Calcium: 8.9 mg/dL (ref 8.9–10.3)
Creatinine, Ser: 0.72 mg/dL (ref 0.44–1.00)
GFR calc Af Amer: >60 mL/min (ref >60)
Glucose, Bld: 112 mg/dL — ABNORMAL HIGH (ref 70–99)
POTASSIUM: 3.8 mmol/L (ref 3.5–5.1)
SODIUM: 140 mmol/L (ref 135–145)
Total Bilirubin: 0.4 mg/dL (ref 0.3–1.2)
Total Protein: 7.4 g/dL (ref 6.5–8.1)

## 2018-03-31 LAB — URINALYSIS, ROUTINE W REFLEX MICROSCOPIC
BILIRUBIN URINE: NEGATIVE
Glucose, UA: NEGATIVE mg/dL
HGB URINE DIPSTICK: NEGATIVE
KETONES UR: NEGATIVE mg/dL
Leukocytes, UA: NEGATIVE
Nitrite: NEGATIVE
PH: 5 (ref 5.0–8.0)
Protein, ur: NEGATIVE mg/dL
SPECIFIC GRAVITY, URINE: 1.016 (ref 1.005–1.030)

## 2018-03-31 LAB — D-DIMER, QUANTITATIVE: D-Dimer, Quant: <0.27 ug/mL-FEU (ref 0.00–0.50)

## 2018-03-31 LAB — CBC WITH DIFFERENTIAL/PLATELET
Abs Immature Granulocytes: 0.03 10*3/uL (ref 0.00–0.07)
BASOS ABS: 0 10*3/uL (ref 0.0–0.1)
Basophils Relative: 0 %
Eosinophils Absolute: 0.2 10*3/uL (ref 0.0–0.5)
Eosinophils Relative: 3 %
HEMATOCRIT: 45.1 % (ref 36.0–46.0)
HEMOGLOBIN: 14.5 g/dL (ref 12.0–15.0)
IMMATURE GRANULOCYTES: 0 %
LYMPHS ABS: 2.6 10*3/uL (ref 0.7–4.0)
LYMPHS PCT: 33 %
MCH: 29.8 pg (ref 26.0–34.0)
MCHC: 32.2 g/dL (ref 30.0–36.0)
MCV: 92.6 fL (ref 80.0–100.0)
Monocytes Absolute: 0.6 10*3/uL (ref 0.1–1.0)
Monocytes Relative: 7 %
NEUTROS PCT: 57 %
NRBC: 0 % (ref 0.0–0.2)
Neutro Abs: 4.6 10*3/uL (ref 1.7–7.7)
Platelets: 142 10*3/uL — ABNORMAL LOW (ref 150–400)
RBC: 4.87 MIL/uL (ref 3.87–5.11)
RDW: 12.3 % (ref 11.5–15.5)
WBC: 8.1 10*3/uL (ref 4.0–10.5)

## 2018-03-31 LAB — LIPASE, BLOOD: LIPASE: 34 U/L (ref 11–51)

## 2018-03-31 MED ORDER — MORPHINE SULFATE (PF) 4 MG/ML IV SOLN
4.0000 mg | Freq: Once | INTRAVENOUS | Status: AC
  Administered 2018-03-31: 4 mg via INTRAVENOUS
  Filled 2018-03-31: qty 1

## 2018-03-31 MED ORDER — SODIUM CHLORIDE 0.9 % IV BOLUS
1000.0000 mL | Freq: Once | INTRAVENOUS | Status: AC
Start: 2018-03-31 — End: 2018-03-31
  Administered 2018-03-31: 1000 mL via INTRAVENOUS

## 2018-03-31 MED ORDER — NAPROXEN 500 MG PO TABS: 500.0000 mg | ORAL_TABLET | Freq: Two times a day (BID) | ORAL | 0 refills | Status: DC

## 2018-03-31 MED ORDER — KETOROLAC TROMETHAMINE 30 MG/ML IJ SOLN
30.0000 mg | Freq: Once | INTRAMUSCULAR | Status: AC
Start: 2018-03-31 — End: 2018-03-31
  Administered 2018-03-31: 30 mg via INTRAVENOUS
  Filled 2018-03-31: qty 1

## 2018-03-31 MED ORDER — ONDANSETRON HCL 4 MG/2ML IJ SOLN
4.0000 mg | Freq: Once | INTRAMUSCULAR | Status: AC
  Administered 2018-03-31: 4 mg via INTRAVENOUS
  Filled 2018-03-31: qty 2

## 2018-03-31 NOTE — ED Triage Notes (Signed)
Pt presents with shortness of breath and elevated bp along with right upper abdominal pain that started 30 min ago.

## 2018-03-31 NOTE — ED Provider Notes (Signed)
[facility] DEPT Provider Note   CSN: [id number] Arrival date & time: 03/31/18  0617     History   Chief Complaint Chief Complaint  Patient presents with  . Shortness of Breath    HPI [name] is a 58 y.o. female.  [name] is a 58 y.o. 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  She reports intermittent sharp pains in the right upper quadrant that have become more frequent and progressively worsened over the past 30 minutes prior to arrival.  She reports associated shortness of breath and that the pain is worse when she takes a deep breath or moves.  She denies any pain in the central chest.  No pain radiating to the back.  She denies any fevers, chills or cough.  No nausea or vomiting.  Denies any urinary symptoms.  No diarrhea, melena or hematochezia.  No history of similar pains in the past.  Reports prior bladder surgery for her cancer and abdominal hysterectomy but no other past surgical history.  She has not taken anything to treat her pain prior to arrival, no other aggravating or alleviating factors.  Denies prior history of PE or DVT, no lower extremity swelling or pain, no recent long distance travel or surgeries.  Patient was working overnight with started to feel unwell asked a nurse on the floor to check her blood pressure was noted to be elevated, and was 204/102 on arrival, patient denies history of hypertension, and has not previously been on blood pressure medications.     Past Medical History:  Diagnosis Date  . Renal disorder    bladder cancer    There are no active problems to display for this patient.   Past Surgical History:  Procedure Laterality Date  . ABDOMINAL HYSTERECTOMY    . BLADDER SURGERY     bladder  cancer     OB History   No obstetric history on file.      Home Medications    Prior to Admission medications   Medication Sig Start Date End Date Taking? Authorizing Provider  naproxen (NAPROSYN) 500 MG tablet Take 1 tablet (500 mg total) by mouth 2 (two) times daily. 03/31/18   Jacqlyn Larsen, PA-C    Family History No family history on file.  Social History Social History   Tobacco Use  . Smoking status: Current Some Day Smoker    Types: Cigarettes  . Smokeless tobacco: Never Used  Substance Use Topics  . Alcohol use: Never    Frequency: Never  . Drug use: Never     Allergies   Patient has no known allergies.   Review of Systems Review of Systems  Constitutional: Negative for chills and fever.  HENT: Negative for congestion, rhinorrhea and sore throat.   Eyes: Negative for visual disturbance.  Respiratory: Positive for shortness of breath. Negative for cough, chest tightness and wheezing.   Cardiovascular: Positive for chest pain.  Gastrointestinal: Positive for abdominal pain. Negative for blood in stool, constipation, diarrhea, nausea and vomiting.  Genitourinary: Negative for dysuria, flank pain, frequency and hematuria.  Musculoskeletal: Negative for arthralgias, back pain and joint swelling.  Skin: Negative for color change and rash.  Neurological: Negative for dizziness, syncope, light-headedness and headaches.     Physical Exam Updated Vital Signs BP (!) 204/102 (BP  Location: Right Arm)   Pulse 99   Temp 98.7 F (37.1 C) (Oral)   Ht 5\' 8"  (1.727 m)   Wt 113.4 kg   SpO2 94%   BMI 38.01 kg/m   Physical Exam Vitals signs and nursing note reviewed.  Constitutional:      General: She is not in acute distress.    Appearance: She is well-developed. She is obese. She is not ill-appearing, toxic-appearing or diaphoretic.  HENT:     Head: Normocephalic and atraumatic.     Mouth/Throat:     Mouth: Mucous membranes are moist.     Pharynx:  Oropharynx is clear.  Eyes:     General:        Right eye: No discharge.        Left eye: No discharge.     Pupils: Pupils are equal, round, and reactive to light.  Neck:     Musculoskeletal: Neck supple.  Cardiovascular:     Rate and Rhythm: Normal rate and regular rhythm.     Pulses: Normal pulses.     Heart sounds: Normal heart sounds. No murmur. No friction rub. No gallop.   Pulmonary:     Effort: Pulmonary effort is normal. No respiratory distress.     Breath sounds: Normal breath sounds. No wheezing or rales.     Comments: Respirations equal and unlabored, patient able to speak in full sentences, lungs clear to auscultation bilaterally  Chest:     Chest wall: Tenderness present.     Comments: Chest wall with some mild tenderness over the right lower chest, no overlying rash or skin changes, no palpable deformity Abdominal:     General: Bowel sounds are normal. There is no distension.     Palpations: Abdomen is soft. There is no mass.     Tenderness: There is abdominal tenderness. There is no guarding.     Comments: Abdomen soft and nondistended, bowel sounds present throughout, there is tenderness in the right upper quadrant extending from the right lower chest wall into the abdomen, there is no guarding, negative Murphy sign, no rigidity, no peritoneal signs.  Musculoskeletal:        General: No deformity.     Right lower leg: She exhibits no tenderness. No edema.     Left lower leg: She exhibits no tenderness. No edema.  Skin:    General: Skin is warm and dry.     Capillary Refill: Capillary refill takes less than 2 seconds.  Neurological:     General: No focal deficit present.     Mental Status: She is alert and oriented to person, place, and time.     Coordination: Coordination normal.     Comments: Speech is clear, able to follow commands CN III-XII intact Normal strength in upper and lower extremities bilaterally including dorsiflexion and plantar flexion, strong and  equal grip strength Sensation normal to light and sharp touch Moves extremities without ataxia, coordination intact   Psychiatric:        Mood and Affect: Mood normal.        Behavior: Behavior normal.      ED Treatments / Results  Labs (all labs ordered are listed, but only abnormal results are displayed) Labs Reviewed  COMPREHENSIVE METABOLIC PANEL - Abnormal; Notable for the following components:      Result Value   Glucose, Bld 112 (*)    All other components within normal limits  CBC WITH DIFFERENTIAL/PLATELET - Abnormal; Notable for the following components:  Platelets 142 (*)    All other components within normal limits  URINALYSIS, ROUTINE W REFLEX MICROSCOPIC - Abnormal; Notable for the following components:   APPearance HAZY (*)    All other components within normal limits  LIPASE, BLOOD  D-DIMER, QUANTITATIVE (NOT AT [facility])  I-STAT TROPONIN, ED    EKG EKG Interpretation  Date/Time:  Tuesday March 31 2018 06:19:47 EST Ventricular Rate:  100 PR Interval:    QRS Duration: 89 QT Interval:  374 QTC Calculation: 483 R Axis:   55 Text Interpretation:  Sinus tachycardia No significant change was found Confirmed by Shanon Rosser [phone]) on 03/31/2018 6:31:45 AM Also confirmed by Shanon Rosser [phone]), editor Alanda Slim, Levada Dy [phone])  on 03/31/2018 7:29:49 AM   Radiology Dg Chest 2 View  Result Date: 03/31/2018 CLINICAL DATA:  New onset right upper quadrant abdominal pain and shortness of breath. EXAM: CHEST - 2 VIEW COMPARISON:  11/06/2010 FINDINGS: Heart size is normal. Mediastinal shadows are normal. The lungs are clear. No bronchial thickening. No infiltrate, mass, effusion or collapse. Pulmonary vascularity is normal. No bony abnormality. IMPRESSION: Normal chest. Electronically Signed   By: Nelson Chimes M.D.   On: 03/31/2018 07:35   Ct Renal Stone Study  Result Date: 03/31/2018 CLINICAL DATA:  Right flank region pain. History urinary bladder carcinoma EXAM: CT  ABDOMEN AND PELVIS WITHOUT CONTRAST TECHNIQUE: Multidetector CT imaging of the abdomen and pelvis was performed following the standard protocol without oral or IV contrast. COMPARISON:  November 22, 2016 FINDINGS: Lower chest: There is a small right pleural effusion. There is bibasilar atelectatic change. There is a 2 mm nodular opacity arising from a fissure in the right upper lobe, a probable small intrafissural lymph node. No airspace consolidation noted in the lung bases. Hepatobiliary: No focal liver lesions are apparent on this noncontrast enhanced study. Gallbladder wall is not appreciably thickened. There is no biliary duct dilatation. Pancreas: There is no pancreatic mass or inflammatory focus. Spleen: No splenic lesions are evident. Adrenals/Urinary Tract: No adrenal lesions are evident. Kidneys bilaterally show no evident mass or hydronephrosis on either side. There is no evident renal or ureteral calculus. Urinary bladder wall is midline with wall thickness within normal limits. No mass or wall thickening evident within the urinary bladder. There has been interval removal of mass from the posterior leftward aspect of the bladder since prior study. Stomach/Bowel: There is no appreciable bowel wall or mesenteric thickening. There is no evident bowel obstruction. No evident free air or portal venous air. Vascular/Lymphatic: There is aortoiliac atherosclerosis. There is modest dilatation of the distal abdominal aorta with a maximum transverse diameter of 2.7 x 2.3 cm. No appreciable calcification noted in the major mesenteric arterial vessels. There is no evident adenopathy in the abdomen or pelvis. There are subcentimeter inguinal lymph nodes, considered nonspecific. Reproductive: Prostate and seminal vesicles appear normal in size and contour. No evident pelvic mass. Other: Appendix appears normal. No evident abscess or ascites in the abdomen or pelvis. Small ventral hernia containing only fat noted.  Musculoskeletal: There is degenerative change in the lumbar spine. There are no blastic or lytic bone lesions. There is no intramuscular lesion evident. IMPRESSION: 1. Interval removal of mass from urinary bladder. No urinary bladder lesion evident currently. No hydronephrosis on either side. No evident renal or ureteral calculus. 2. No evident bowel obstruction. No abscess in the abdomen or pelvis. Appendix appears normal. 3.  Fairly small ventral hernia containing only fat noted. 4. Small right pleural effusion. 2  mm nodular opacity on the right, a probable intrafissural lymph node. Given history of previous urinary bladder carcinoma, a follow-up CT of the chest in 3 months to confirm stability of this nodular lesion may be warranted. 5.  There is aortoiliac atherosclerosis. Aortic Atherosclerosis (ICD10-I70.0). Electronically Signed   By: Lowella Grip III M.D.   On: 03/31/2018 09:39   US Abdomen Limited Ruq  Result Date: 03/31/2018 CLINICAL DATA:  Right upper quadrant pain for 1 hour EXAM: ULTRASOUND ABDOMEN LIMITED RIGHT UPPER QUADRANT COMPARISON:  01/02/2017 renal ultrasound FINDINGS: Gallbladder: No gallstones or wall thickening visualized. No sonographic Murphy sign noted by sonographer. Common bile duct: Diameter: 4 mm.  Where visualized, no filling defect. Liver: Echogenic liver with poor acoustic penetration and sparing around the gallbladder. Portal vein is patent on color Doppler imaging with normal direction of blood flow towards the liver. IMPRESSION: 1. Hepatic steatosis. 2. Negative gallbladder. Electronically Signed   By: Monte Fantasia M.D.   On: 03/31/2018 07:15    Procedures Procedures (including critical care time)  Medications Ordered in ED Medications  sodium chloride 0.9 % bolus 1,000 mL (0 mLs Intravenous Stopped 03/31/18 0925)  ondansetron (ZOFRAN) injection 4 mg (4 mg Intravenous Given 03/31/18 0701)  morphine 4 MG/ML injection 4 mg (4 mg Intravenous Given 03/31/18 0701)    ketorolac (TORADOL) 30 MG/ML injection 30 mg (30 mg Intravenous Given 03/31/18 0924)     Initial Impression / Assessment and Plan / ED Course  I have reviewed the triage vital signs and the nursing notes.  Pertinent labs & imaging results that were available during my care of the patient were reviewed by me and considered in my medical decision making (see chart for details).  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  On arrival she is noted to be hypertensive, denies history of hypertension, all other vitals normal.  No central chest pain.  Pain is well localized to the right lower chest wall and right upper quadrant of the abdomen and patient does not have peritoneal signs.  Pain is worse with inspiration and patient reports mild associated shortness of breath.  No lightheadedness or syncope.  No prior cardiac history.  No lower extremity swelling or pain and no history of PE or DVT.  Patient is not tachycardic or hypoxic on arrival.  I have low suspicion for ACS given patient of pain.  Presentation seems atypical for dissection would expect pain to be more central and left-sided radiating to the back rhythm and well realized in the right upper quadrant, patient not having any paresthesias, and does not have prolonged history of hypertension although is hypertensive today.  Lung sounds are clear, patient with intermediate risk for PE, will get d-dimer.  Given right upper quadrant abdominal pain will also check right upper quadrant ultrasound to assess for gallbladder pathology as well as basic labs.  Will give IV fluids, refusing food for symptomatic management.  EKG shows sinus rhythm without concerning changes.  Troponin negative.  No leukocytosis and normal hemoglobin, no acute electrolyte derangements, normal renal liver function  and normal lipase.  D-dimer is negative.  Urinalysis without signs of infection.  Chest x-ray shows no active cardiopulmonary disease and right upper quadrant ultrasound shows no evidence of acute cholecystitis, normal diameter of the common bile duct and no evidence of gallstones  to explain patient's pain.  On reevaluation she is still significantly uncomfortable.  Given normal renal function will give dose of Toradol given location of pain could also be referred from the right flank will get CT renal study to assess for any renal mass or nephrolithiasis.  Urinalysis not consistent with pyelonephritis.  CT renal stone study does not show any evidence of nephrolithiasis or acute obstructive uropathy but does show evidence of a small pleural effusion within the right lower lung and I suspect this is what is causing patient's pain, unclear etiology for patient's pleural effusion she has no other signs of fluid overload and no history of CHF.  Does have history of bladder cancer which does raise some concern for malignancy but given that patient is not hypoxic or tachypneic and on reevaluation pain has significantly improved feel this is stable for outpatient work-up.  Patient's daughter is at the bedside and has already obtained follow-up appointment in the next few days for patient with primary care.  At this time I feel patient is stable for discharge home with continued treatment with NSAIDs and close follow-up with PCP.  Strict return precautions discussed.  Patient's blood pressure has steadily improved with treatment of her pain, and I do not think that patient has any signs of hypertensive urgency or emergency.  Discharged home in good condition.  Vitals:   03/31/18 1109 03/31/18 1310  BP: (!) 144/69 (!) 149/69  Pulse: 78 78  Resp: 16 18  Temp:    SpO2: 91% 96%     Final Clinical Impressions(s) / ED Diagnoses   Final diagnoses:  RUQ pain  Pleural effusion, right  SOB (shortness of breath)     ED Discharge Orders         Ordered    naproxen (NAPROSYN) 500 MG tablet  2 times daily     03/31/18 1213           Jacqlyn Larsen, [place] 04/02/18 1711    Lacretia Leigh, MD 04/05/18 [phone]

## 2018-03-31 NOTE — Discharge Instructions (Signed)
Pain is likely caused by a small fluid collection within the lining of your right lung.  Your work-up shows no evidence of pneumonia, blood clot, or acute problem with your gallbladder.  You may use Naprosyn twice daily as needed for pain, it is safe to take Tylenol in addition to this.  I would like for you to follow-up with your primary care doctor at your scheduled appointment this Thursday to discuss pleural effusion.  If you have worsening shortness of breath or chest pain, fevers, feel lightheaded or like you may pass out or any other new or concerning symptoms please return to the emergency department for reevaluation.

## 2018-04-02 ENCOUNTER — Ambulatory Visit (INDEPENDENT_AMBULATORY_CARE_PROVIDER_SITE_OTHER): Payer: 59 | Admitting: Family Medicine

## 2018-04-02 ENCOUNTER — Encounter: Payer: Self-pay | Admitting: Family Medicine

## 2018-04-02 VITALS — BP 146/80 | HR 78 | Temp 97.8°F | Ht 68.0 in | Wt 266.0 lb

## 2018-04-02 DIAGNOSIS — I152 Hypertension secondary to endocrine disorders: Secondary | ICD-10-CM | POA: Insufficient documentation

## 2018-04-02 DIAGNOSIS — J90 Pleural effusion, not elsewhere classified: Secondary | ICD-10-CM | POA: Insufficient documentation

## 2018-04-02 DIAGNOSIS — Z8551 Personal history of malignant neoplasm of bladder: Secondary | ICD-10-CM | POA: Diagnosis not present

## 2018-04-02 DIAGNOSIS — L989 Disorder of the skin and subcutaneous tissue, unspecified: Secondary | ICD-10-CM | POA: Insufficient documentation

## 2018-04-02 DIAGNOSIS — R030 Elevated blood-pressure reading, without diagnosis of hypertension: Secondary | ICD-10-CM | POA: Diagnosis not present

## 2018-04-02 DIAGNOSIS — I10 Essential (primary) hypertension: Secondary | ICD-10-CM | POA: Insufficient documentation

## 2018-04-02 MED ORDER — TRIAMCINOLONE ACETONIDE 0.5 % EX OINT: 1.0000 "application " | TOPICAL_OINTMENT | Freq: Two times a day (BID) | CUTANEOUS | 0 refills | Status: DC

## 2018-04-02 NOTE — Patient Instructions (Signed)
It was very nice to see you today!  Please start the cream for your skin lesion.  Please let me know if no improvement in the next couple of weeks and we will get a skin biopsy.  We will recheck your CT scan in a couple of months.  Please let me know if you have any worsening or recurrence of your symptoms.  Please come back in the next 2 months for your physical with blood work.  Given on your blood pressures not enough persistently 140/90 or higher.  Take care, Dr [name]

## 2018-04-02 NOTE — Assessment & Plan Note (Signed)
Irregular appearance due to home treatment with iodine.  Recommended biopsy today for definitive diagnosis, however patient declined.  Area is very pruritic-we will start topical triamcinolone.  Advised her to return in a couple of weeks if not improving for punch biopsy.  She voiced understanding.

## 2018-04-02 NOTE — Progress Notes (Signed)
Subjective:  [name] is a 58 y.o. female who presents today with a chief complaint of RUQ abdominal pain and to establish care.   HPI:  RUQ Abdominal Pain Patient had an episode 2 days ago in which she had sudden onset severe right upper quadrant abdominal pain associated with shortness of breath.  She currently works at [employer].  She was at work when the symptoms occurred.  Shortly after her symptoms occurred, a coworker checked her blood pressure tended to be in the 200s over 100s.  She was then transported to the emergency department.  While in the emergency department she had a right upper quadrant ultrasound which showed hepatic steatosis with no gallstones and a chest x-ray which was negative.  Had blood work that was also within normal limits.  She then had a CT abdomen performed which showed a small right pleural effusion with a nodular opacity.  Patient was given IV fluids, Zofran, morphine, and Toradol.  Her symptoms improved and she was discharged home.  Over the last day, her symptoms have continued to improve.  She now rates the pain is 2 out of 10.  It is worse with deep inspiration.  She does not have any associated shortness of breath or pleuritic pain.  She has never had anything like this before.  No orthopnea.  No leg edema.  No PND.  No other specific treatments tried.  Elevated blood pressure reading As noted above, patient was found to have severely elevated blood pressure while in the emergency department.  She routinely checks her blood pressure at home and at work and it is typically in the 130s over 70s to 80s.  Skin lesion She has never had this evaluated by physician before.  Has had a lesion on the lateral aspect of her left lower leg.  She has been treating with topical iodine with no improvement.  Area is very pruritic.  Lesion has been there for several months.  Her stable, chronic medical conditions are outlined below:  # Bladder Cancer s/p  partial cystectomy - Follows with urology, getting BCG infusion  ROS: Per HPI, otherwise a complete review of systems was negative.   PMH:  The following were reviewed and entered/updated in epic: Past Medical History:  Diagnosis Date  . Bladder cancer [facility])    Patient Active Problem List   Diagnosis Date Noted  . Pleural effusion 04/02/2018  . Elevated blood pressure reading 04/02/2018  . Skin lesion 04/02/2018  . History of bladder cancer 04/02/2018   Past Surgical History:  Procedure Laterality Date  . ABDOMINAL HYSTERECTOMY    . BLADDER SURGERY     bladder cancer   Family History  Problem Relation Age of Onset  . Stroke Mother   . Diabetes Father     Medications- reviewed and updated Current Outpatient Medications  Medication Sig Dispense Refill  . naproxen (NAPROSYN) 500 MG tablet Take 1 tablet (500 mg total) by mouth 2 (two) times daily. 30 tablet 0  . triamcinolone ointment (KENALOG) 0.5 % Apply 1 application topically 2 (two) times daily. 30 g 0   No current facility-administered medications for this visit.     Allergies-reviewed and updated No Known Allergies  Social History   Socioeconomic History  . Marital status: Married    Spouse name: Not on file  . Number of children: Not on file  . Years of education: Not on file  . Highest education level: Not on file  Occupational History  .  Not on file  Social Needs  . Financial resource strain: Not on file  . Food insecurity:    Worry: Not on file    Inability: Not on file  . Transportation needs:    Medical: Not on file    Non-medical: Not on file  Tobacco Use  . Smoking status: Current Some Day Smoker    Types: Cigarettes  . Smokeless tobacco: Never Used  Substance and Sexual Activity  . Alcohol use: Never    Frequency: Never  . Drug use: Never  . Sexual activity: Not on file  Lifestyle  . Physical activity:    Days per week: Not on file    Minutes per session: Not on file  . Stress: Not  on file  Relationships  . Social connections:    Talks on phone: Not on file    Gets together: Not on file    Attends religious service: Not on file    Active member of club or organization: Not on file    Attends meetings of clubs or organizations: Not on file    Relationship status: Not on file  Other Topics Concern  . Not on file  Social History Narrative  . Not on file     Objective:  Physical Exam: BP (!) 146/80 (BP Location: Left Arm, Patient Position: Sitting, Cuff Size: Large)   Pulse 78   Temp 97.8 F (36.6 C) (Oral)   Ht 5\' 8"  (1.727 m)   Wt 266 lb (120.7 kg)   SpO2 94%   BMI 40.45 kg/m   Gen: NAD, resting comfortably CV: RRR with no murmurs appreciated Pulm: NWOB, CTAB with no crackles, wheezes, or rhonchi GI: Obese.  Normal bowel sounds present. Soft, Nontender, Nondistended.  No rebound or guarding. MSK: No edema, cyanosis, or clubbing noted Skin: Warm, dry.  Irregular, yellow, hyperpigmented lesion approximately 3 cm in diameter on lateral aspect of left lower leg.  Several excoriations present. Neuro: Grossly normal, moves all extremities Psych: Normal affect and thought content  Assessment/Plan:  Skin lesion Irregular appearance due to home treatment with iodine.  Recommended biopsy today for definitive diagnosis, however patient declined.  Area is very pruritic-we will start topical triamcinolone.  Advised her to return in a couple of weeks if not improving for punch biopsy.  She voiced understanding.  Pleural effusion Think that this is the most likely explanation for her right upper quadrant abdominal pain and associated shortness of breath.  Recommended pulmonology consult for possible thoracocentesis and to determine etiology for her pleural effusion however patient declined.  She does not have any cardiomegaly or any signs of heart failure.  Given her history of bladder cancer, there is concerned this could possibly represent malignancy.  Discussed  possible etiologies with patient.  We will repeat chest CT in 3 months per radiology recommendations.  Discussed strict reasons to return to care and seek emergent care.  History of bladder cancer Follows with urology.  Elevated blood pressure reading Slightly elevated today.  Typically at goal.  Given her home readings are at goal, we will not start therapy today.  Discussed home blood pressure monitoring with goal 140/90 or lower.   Algis Greenhouse. [name], MD 04/02/2018 10:09 AM

## 2018-04-02 NOTE — Assessment & Plan Note (Signed)
Follows with urology

## 2018-04-02 NOTE — Assessment & Plan Note (Signed)
Slightly elevated today.  Typically at goal.  Given her home readings are at goal, we will not start therapy today.  Discussed home blood pressure monitoring with goal 140/90 or lower.

## 2018-04-02 NOTE — Assessment & Plan Note (Signed)
Think that this is the most likely explanation for her right upper quadrant abdominal pain and associated shortness of breath.  Recommended pulmonology consult for possible thoracocentesis and to determine etiology for her pleural effusion however patient declined.  She does not have any cardiomegaly or any signs of heart failure.  Given her history of bladder cancer, there is concerned this could possibly represent malignancy.  Discussed possible etiologies with patient.  We will repeat chest CT in 3 months per radiology recommendations.  Discussed strict reasons to return to care and seek emergent care.

## 2018-04-28 ENCOUNTER — Ambulatory Visit (INDEPENDENT_AMBULATORY_CARE_PROVIDER_SITE_OTHER): Payer: 59 | Admitting: Family Medicine

## 2018-04-28 ENCOUNTER — Encounter: Payer: Self-pay | Admitting: Family Medicine

## 2018-04-28 VITALS — BP 123/79 | HR 84 | Temp 97.1°F | Ht 68.0 in | Wt 261.2 lb

## 2018-04-28 DIAGNOSIS — G5711 Meralgia paresthetica, right lower limb: Secondary | ICD-10-CM | POA: Insufficient documentation

## 2018-04-28 DIAGNOSIS — L989 Disorder of the skin and subcutaneous tissue, unspecified: Secondary | ICD-10-CM | POA: Diagnosis not present

## 2018-04-28 DIAGNOSIS — R030 Elevated blood-pressure reading, without diagnosis of hypertension: Secondary | ICD-10-CM | POA: Diagnosis not present

## 2018-04-28 MED ORDER — AMLODIPINE BESYLATE 5 MG PO TABS: 5.0000 mg | ORAL_TABLET | Freq: Every day | ORAL | 3 refills | Status: DC

## 2018-04-28 MED ORDER — CLOBETASOL PROPIONATE 0.05 % EX OINT: 1.0000 "application " | TOPICAL_OINTMENT | Freq: Two times a day (BID) | CUTANEOUS | 0 refills | Status: DC

## 2018-04-28 NOTE — Assessment & Plan Note (Signed)
Possible psoriasis.  Will try clobetasol.  If no improvement in 1 week, will need punch biopsy.

## 2018-04-28 NOTE — Assessment & Plan Note (Signed)
At goal today however took her husband's losartan yesterday.  Given her elevated home readings, we will start amlodipine 5 mg daily.  Discussed potential side effects.  Encouraged home blood pressure monitoring with goal 140/90 or lower.  Discussed importance of regular exercise and low-salt diet.

## 2018-04-28 NOTE — Progress Notes (Signed)
   Chief Complaint:  [name] is a 58 y.o. female who presents today with a chief complaint of elevated blood pressure readings.   Assessment/Plan:  Skin lesion Possible psoriasis.  Will try clobetasol.  If no improvement in 1 week, will need punch biopsy.  Meralgia paresthetica of right side No red flags.  Otherwise normal neurological exam.  Reassured patient.  Discussed activities to avoid.  Encouraged weight loss.  Discussed reasons to return to care.  Elevated blood pressure reading At goal today however took her husband's losartan yesterday.  Given her elevated home readings, we will start amlodipine 5 mg daily.  Discussed potential side effects.  Encouraged home blood pressure monitoring with goal 140/90 or lower.  Discussed importance of regular exercise and low-salt diet.    Subjective:  HPI:  # Leg Numbness - Started several years ago.  Stable over that time. - Located on right upper outer thigh.  - No treatments tried. No obvious alleviating or aggravating factors.  - ROS: No weakness.  No back pain.  No other numbness.  No bowel or bladder changes.  # Skin Lesion - Trying topical triamcinolone with no significant  # Elevated BP readings  - Not currently on any medications - Home readings have been in the 150s-160s/90s-100s - She took her husband's home losartan 50mg  and noticed improvement into the 130s over 80s. - ROS: No CP or SOB  ROS: Per HPI  PMH: She reports that she has been smoking cigarettes. She has never used smokeless tobacco. She reports that she does not drink alcohol or use drugs.      Objective:  Physical Exam: BP 123/79   Pulse 84   Temp (!) 97.1 F (36.2 C) (Oral)   Ht 5\' 8"  (1.727 m)   Wt 261 lb 3.2 oz (118.5 kg)   SpO2 94%   BMI 39.72 kg/m   Gen: NAD, resting comfortably CV: Regular rate and rhythm with no murmurs appreciated Pulm: Normal work of breathing, clear to auscultation bilaterally with no crackles, wheezes, or  rhonchi Skin: Well-demarcated erythematous patch approximately 3 cm in diameter on lateral aspect of left lower leg with overlying silvery scale and excoriations. Neuro: Cranial nerves II through XII intact.  Strength 5 out of 5 in upper and lower extremities.  Right anterior lateral thigh with decreased sensation compared to left.  Otherwise normal neurological exam in the lower extremities bilaterally. Psych: Normal affect and thought content     Caleb M. [name], MD 04/28/2018 8:37 AM

## 2018-04-28 NOTE — Patient Instructions (Signed)
It was very nice to see you today!  Please start the amlodipine. Let me know if your BP is persistently 140/90 or higher.  Please try the clobetasol twice daily for the next week.  If your area does not improve, we may need to get a biopsy of the area.  You have meralgia paresthetica.  This is a benign condition.  Losing weight will help with this.  Take care, Dr [name]

## 2018-04-28 NOTE — Assessment & Plan Note (Signed)
No red flags.  Otherwise normal neurological exam.  Reassured patient.  Discussed activities to avoid.  Encouraged weight loss.  Discussed reasons to return to care.

## 2018-06-04 MED FILL — AMLODIPINE BESYLATE 5 MG TA: 5 | 30 days supply | Qty: 30 | Fill #0

## 2018-08-16 MED FILL — AMLODIPINE BESYLATE 5 MG TA: 5 | 30 days supply | Qty: 30 | Fill #1

## 2018-09-15 MED FILL — AMLODIPINE BESYLATE 5 MG TA: 5 | 90 days supply | Qty: 90 | Fill #2

## 2018-10-12 DIAGNOSIS — C672 Malignant neoplasm of lateral wall of bladder: Secondary | ICD-10-CM | POA: Diagnosis not present

## 2018-12-27 MED FILL — AMLODIPINE BESYLATE 5 MG TA: 5 | 90 days supply | Qty: 90 | Fill #3

## 2019-01-25 ENCOUNTER — Telehealth: Payer: Self-pay

## 2019-01-25 NOTE — Telephone Encounter (Signed)
Patient called back and stated that medication was estradiol 0.1 mg

## 2019-01-25 NOTE — Telephone Encounter (Signed)
See note

## 2019-01-25 NOTE — Telephone Encounter (Signed)
Copied from [place] [id number]. Topic: General - Other >> Jan 25, 2019 12:27 PM Yvette Rack wrote: Reason for CRM: Pt stated she would like a call from Dr. [name] to discuss a Rx for a topical cream. Pt stated she does not want to pay for a visit when she knows exactly which medication she needs. Pt requests call back. Cb# [phone]

## 2019-01-25 NOTE — Telephone Encounter (Signed)
Left voice message for patient to leave the name of the topical cream requesting.

## 2019-01-26 ENCOUNTER — Other Ambulatory Visit: Payer: Self-pay

## 2019-01-26 MED ORDER — ESTRADIOL 0.1 MG/GM VA CREA: TOPICAL_CREAM | VAGINAL | 6 refills | Status: DC

## 2019-01-26 NOTE — Telephone Encounter (Signed)
Rx sent patient notified 

## 2019-01-26 NOTE — Telephone Encounter (Signed)
Ok with me. Please place any necessary orders. 

## 2019-01-26 NOTE — Telephone Encounter (Signed)
Patient requesting Estradiol 0.1 mg topical cream she has used this  for years due to her past hysterectomy . Please advise

## 2019-01-28 MED FILL — ESTRADIOL 0.1 MG/GM CRM: 0.1 | 30 days supply | Qty: 43 | Fill #0

## 2019-04-05 MED FILL — AMLODIPINE BESYLATE 5 MG TA: 5 | 90 days supply | Qty: 90 | Fill #4

## 2019-05-27 DIAGNOSIS — N3289 Other specified disorders of bladder: Secondary | ICD-10-CM | POA: Diagnosis not present

## 2019-05-27 DIAGNOSIS — C672 Malignant neoplasm of lateral wall of bladder: Secondary | ICD-10-CM | POA: Diagnosis not present

## 2019-06-14 DIAGNOSIS — Z6841 Body Mass Index (BMI) 40.0 and over, adult: Secondary | ICD-10-CM | POA: Diagnosis not present

## 2019-06-14 DIAGNOSIS — L900 Lichen sclerosus et atrophicus: Secondary | ICD-10-CM | POA: Diagnosis not present

## 2019-06-14 DIAGNOSIS — R102 Pelvic and perineal pain: Secondary | ICD-10-CM | POA: Diagnosis not present

## 2019-06-14 DIAGNOSIS — Z01419 Encounter for gynecological examination (general) (routine) without abnormal findings: Secondary | ICD-10-CM | POA: Diagnosis not present

## 2019-06-14 MED FILL — BETAMETHASONE VALERATE 0.1: 0.1 | 30 days supply | Qty: 45 | Fill #0

## 2019-06-16 LAB — HM PAP SMEAR

## 2019-06-21 DIAGNOSIS — Z1231 Encounter for screening mammogram for malignant neoplasm of breast: Secondary | ICD-10-CM | POA: Diagnosis not present

## 2019-07-24 ENCOUNTER — Other Ambulatory Visit: Payer: Self-pay | Admitting: Family Medicine

## 2019-08-02 ENCOUNTER — Telehealth: Payer: Self-pay | Admitting: Family Medicine

## 2019-08-02 NOTE — Telephone Encounter (Signed)
Pt advice to schedule appointment with PCP for refills

## 2019-08-02 NOTE — Telephone Encounter (Signed)
Called pt to schedule an appt. Pt states she cannot schedule an appointment and will go get her medication refilled somewhere else.

## 2019-08-02 NOTE — Telephone Encounter (Signed)
MEDICATION: Amlodipine 5 MG  PHARMACY: [facility]  Comments:   **Let patient know to contact pharmacy at the end of the day to make sure medication is ready. **  ** Please notify patient to allow 48-72 hours to process**  **Encourage patient to contact the pharmacy for refills or they can request refills through [facility]**

## 2019-08-17 ENCOUNTER — Telehealth: Payer: Self-pay | Admitting: Family Medicine

## 2019-08-17 NOTE — Telephone Encounter (Signed)
Ok to schedule. Thanks

## 2019-08-17 NOTE — Telephone Encounter (Signed)
Patient is scheduled for this Friday at 1 pm.

## 2019-08-17 NOTE — Telephone Encounter (Signed)
Patient is calling in this afternoon to get scheduled for an appointment, next available is not until 08/30/19 but patient states she is leaving the country on 6[place]12, offered virtual but states she doesn't feel comfortable doing that. Can I schedule patient for Same Day on Friday?

## 2019-08-20 ENCOUNTER — Other Ambulatory Visit: Payer: Self-pay

## 2019-08-20 ENCOUNTER — Encounter: Payer: Self-pay | Admitting: Family Medicine

## 2019-08-20 ENCOUNTER — Ambulatory Visit: Payer: 59 | Admitting: Family Medicine

## 2019-08-20 VITALS — BP 120/70 | HR 81 | Temp 98.2°F | Ht 68.0 in | Wt 256.2 lb

## 2019-08-20 DIAGNOSIS — L0291 Cutaneous abscess, unspecified: Secondary | ICD-10-CM | POA: Diagnosis not present

## 2019-08-20 DIAGNOSIS — R030 Elevated blood-pressure reading, without diagnosis of hypertension: Secondary | ICD-10-CM | POA: Diagnosis not present

## 2019-08-20 DIAGNOSIS — M25552 Pain in left hip: Secondary | ICD-10-CM | POA: Diagnosis not present

## 2019-08-20 DIAGNOSIS — M25551 Pain in right hip: Secondary | ICD-10-CM

## 2019-08-20 MED ORDER — DOXYCYCLINE HYCLATE 100 MG PO TABS: 100.0000 mg | ORAL_TABLET | Freq: Two times a day (BID) | ORAL | 0 refills | Status: AC

## 2019-08-20 MED ORDER — DICLOFENAC SODIUM 75 MG PO TBEC: 75.0000 mg | DELAYED_RELEASE_TABLET | Freq: Two times a day (BID) | ORAL | 0 refills | Status: DC

## 2019-08-20 MED FILL — DOXYCYCLINE HYCLATE 100 MG: 100 | 10 days supply | Qty: 20 | Fill #0

## 2019-08-20 MED FILL — DICLOFENAC SODIUM 75 MG TAB: 75 | 15 days supply | Qty: 30 | Fill #0

## 2019-08-20 NOTE — Assessment & Plan Note (Signed)
At goal off all medications.  Continue home monitoring.  Would not need any further medications at this point unless becomes persistently elevated again.

## 2019-08-20 NOTE — Progress Notes (Signed)
   [name] is a 59 y.o. female who presents today for an office visit.  Assessment/Plan:  New/Acute Problems: Abscess No red flags.  Does not want I&D today.  Will start doxycycline.  Discussed reasons to return to care.  Chronic Problems Addressed Today: Elevated blood pressure reading At goal off all medications.  Continue home monitoring.  Would not need any further medications at this point unless becomes persistently elevated again.  Bilateral hip pain Likely flareup of bursitis.  Likely also has underlying osteoarthritis.  Will start diclofenac.  Discussed home exercises and handout was given.     Subjective:  HPI:  She has been off all blood pressure medications for the past 2 months.  She has done well.  No reported chest pain or shortness of breath.  She has had bilateral hip pain for the past several months as well.  Has not tried anything for this.  Symptoms are worse with certain motions such as standing or twisting.  Pain mostly located bilateral hips.  She is also had an abscess in her right leg for about a month.  She has some clear drainage.  She does not want to have incision and drainage today.  No reported fevers or chills.        Objective:  Physical Exam: BP 120/70   Pulse 81   Temp 98.2 F (36.8 C)   Ht 5\' 8"  (1.727 m)   Wt 256 lb 3.2 oz (116.2 kg)   SpO2 93%   BMI 38.96 kg/m   Gen: No acute distress, resting comfortably  CV: Regular rate and rhythm with no murmurs appreciated Pulm: Normal work of breathing, clear to auscultation bilaterally with no crackles, wheezes, or rhonchi MSK:  -Back: No deformities -Lower extremities: Tender to patient along greater trochanters bilaterally.  Pain elicited with resisted hip abduction and extension.  Neurovascularly intact distally. Neuro: Grossly normal, moves all extremities Psych: Normal affect and thought content      Parminder Trapani M. [name], MD 08/20/2019 1:53 PM

## 2019-08-20 NOTE — Patient Instructions (Signed)
It was very nice to see you today!  Please take the diclofenac twice daily for the next 1 to 2 weeks for your hip pain.  I think you have a inflamed tendon.  Please take the doxycycline.  Please let me know if your abscess does not improve.  You can stop your blood pressure medication.  Take care, Dr [name]  Please try these tips to maintain a healthy lifestyle:   Eat at least 3 REAL meals and 1-2 snacks per day.  Aim for no more than 5 hours between eating.  If you eat breakfast, please do so within one hour of getting up.    Each meal should contain half fruits/vegetables, one quarter protein, and one quarter carbs (no bigger than a computer mouse)   Cut down on sweet beverages. This includes juice, soda, and sweet tea.     Drink at least 1 glass of water with each meal and aim for at least 8 glasses per day   Exercise at least 150 minutes every week.

## 2019-08-20 NOTE — Assessment & Plan Note (Signed)
Likely flareup of bursitis.  Likely also has underlying osteoarthritis.  Will start diclofenac.  Discussed home exercises and handout was given.

## 2019-08-25 DIAGNOSIS — Z20822 Contact with and (suspected) exposure to covid-19: Secondary | ICD-10-CM | POA: Diagnosis not present

## 2019-08-29 DIAGNOSIS — Z20822 Contact with and (suspected) exposure to covid-19: Secondary | ICD-10-CM | POA: Diagnosis not present

## 2019-08-29 DIAGNOSIS — Z03818 Encounter for observation for suspected exposure to other biological agents ruled out: Secondary | ICD-10-CM | POA: Diagnosis not present

## 2019-08-29 DIAGNOSIS — I10 Essential (primary) hypertension: Secondary | ICD-10-CM | POA: Diagnosis not present

## 2019-12-13 DIAGNOSIS — H109 Unspecified conjunctivitis: Secondary | ICD-10-CM | POA: Diagnosis not present

## 2019-12-14 DIAGNOSIS — B301 Conjunctivitis due to adenovirus: Secondary | ICD-10-CM | POA: Diagnosis not present

## 2019-12-14 DIAGNOSIS — H109 Unspecified conjunctivitis: Secondary | ICD-10-CM | POA: Diagnosis not present

## 2019-12-14 MED FILL — TOBRAMYCIN-DEXAMETH OPTH SU: 0.3-0.1 | 50 days supply | Qty: 5 | Fill #0

## 2019-12-15 ENCOUNTER — Ambulatory Visit: Payer: 59 | Admitting: Physician Assistant

## 2019-12-24 ENCOUNTER — Other Ambulatory Visit (HOSPITAL_COMMUNITY): Payer: Self-pay | Admitting: Ophthalmology

## 2019-12-24 DIAGNOSIS — B0052 Herpesviral keratitis: Secondary | ICD-10-CM | POA: Diagnosis not present

## 2019-12-24 MED FILL — valACYclovir HCL 1 GM TABS: 1 | 14 days supply | Qty: 42 | Fill #0

## 2019-12-24 MED FILL — ZIRGAN 0.15% OPHTHALMIC GEL: 0.15 | 7 days supply | Qty: 5 | Fill #0

## 2019-12-28 DIAGNOSIS — B0052 Herpesviral keratitis: Secondary | ICD-10-CM | POA: Diagnosis not present

## 2020-03-01 ENCOUNTER — Encounter: Payer: Self-pay | Admitting: Family Medicine

## 2020-03-01 ENCOUNTER — Other Ambulatory Visit: Payer: Self-pay | Admitting: Family Medicine

## 2020-03-01 ENCOUNTER — Ambulatory Visit: Payer: 59 | Admitting: Family Medicine

## 2020-03-01 ENCOUNTER — Other Ambulatory Visit: Payer: Self-pay

## 2020-03-01 VITALS — BP 138/85 | HR 83 | Temp 98.1°F | Ht 68.0 in | Wt 257.0 lb

## 2020-03-01 DIAGNOSIS — G5711 Meralgia paresthetica, right lower limb: Secondary | ICD-10-CM

## 2020-03-01 DIAGNOSIS — M722 Plantar fascial fibromatosis: Secondary | ICD-10-CM

## 2020-03-01 MED ORDER — DICLOFENAC SODIUM 75 MG PO TBEC: 75.0000 mg | DELAYED_RELEASE_TABLET | Freq: Two times a day (BID) | ORAL | 0 refills | Status: DC

## 2020-03-01 MED FILL — DICLOFENAC SODIUM 75 MG TAB: 75 | 15 days supply | Qty: 30 | Fill #0

## 2020-03-01 NOTE — Assessment & Plan Note (Signed)
Worsened.  Likely due to altered gait mechanics due to flareup of plantar fasciitis.  No red flags on exam.  Hopefully will have improvement as we treat her plantar fasciitis.  She will be starting diclofenac which should help as well.  May need referral to sports med.

## 2020-03-01 NOTE — Progress Notes (Signed)
   [name] is a 59 y.o. female who presents today for an office visit.  Assessment/Plan:  New/Acute Problems: Plantar fasciitis No red flags.  Discussed home exercises and handout was given.  Will start diclofenac 75 mg twice daily for the next 1 to 2 weeks.  If not improving will need referral to PT and/or sports med.  Chronic Problems Addressed Today: Meralgia paresthetica of right side Worsened.  Likely due to altered gait mechanics due to flareup of plantar fasciitis.  No red flags on exam.  Hopefully will have improvement as we treat her plantar fasciitis.  She will be starting diclofenac which should help as well.  May need referral to sports med.     Subjective:  HPI:  Patient with left heel pain for the last week or so.  Much worse last week.  Better today.  Much worse with first step in the morning.  Pain on the bottom of her foot.  Has tried Tylenol with some improvement.  Has also tried [employer] which helps as well.  She has also had worsening paresthesias on her right hip.       Objective:  Physical Exam: BP 138/85   Pulse 83   Temp 98.1 F (36.7 C) (Temporal)   Ht 5\' 8"  (1.727 m)   Wt 257 lb (116.6 kg)   SpO2 96%   BMI 39.08 kg/m   Gen: No acute distress, resting comfortably MSK: Left foot without deformities.  Pain to palpation along plantar fascia insertion of calcaneus.  Decreased sensation on right lateral thigh.  Neurovascular intact distally. Neuro: Grossly normal, moves all extremities Psych: Normal affect and thought content      Namita Yearwood M. [name], MD 03/01/2020 9:06 AM

## 2020-03-01 NOTE — Patient Instructions (Signed)
It was very nice to see you today!  You have plantar fasciitis.  Please work on the exercises.  Please use ice.  Please also use anti-inflammatory for the next 1 to 2 weeks.  Let me know if not improving and I will refer you to see a sports medicine doctor.  Take care, Dr [name]  Please try these tips to maintain a healthy lifestyle:   Eat at least 3 REAL meals and 1-2 snacks per day.  Aim for no more than 5 hours between eating.  If you eat breakfast, please do so within one hour of getting up.    Each meal should contain half fruits/vegetables, one quarter protein, and one quarter carbs (no bigger than a computer mouse)   Cut down on sweet beverages. This includes juice, soda, and sweet tea.     Drink at least 1 glass of water with each meal and aim for at least 8 glasses per day   Exercise at least 150 minutes every week.

## 2020-05-16 ENCOUNTER — Other Ambulatory Visit: Payer: Self-pay

## 2020-05-16 ENCOUNTER — Ambulatory Visit: Payer: 59 | Admitting: Family Medicine

## 2020-05-16 ENCOUNTER — Encounter: Payer: Self-pay | Admitting: Family Medicine

## 2020-05-16 VITALS — BP 148/86 | HR 82 | Temp 98.4°F | Ht 68.0 in | Wt 259.8 lb

## 2020-05-16 DIAGNOSIS — M5416 Radiculopathy, lumbar region: Secondary | ICD-10-CM

## 2020-05-16 LAB — CBC
HCT: 46.4 % — ABNORMAL HIGH (ref 36.0–46.0)
Hemoglobin: 15.8 g/dL — ABNORMAL HIGH (ref 12.0–15.0)
MCHC: 34.1 g/dL (ref 30.0–36.0)
MCV: 90.8 fl (ref 78.0–100.0)
Platelets: 147 10*3/uL — ABNORMAL LOW (ref 150.0–400.0)
RBC: 5.11 Mil/uL (ref 3.87–5.11)
RDW: 12.8 % (ref 11.5–15.5)
WBC: 8 10*3/uL (ref 4.0–10.5)

## 2020-05-16 LAB — COMPREHENSIVE METABOLIC PANEL
ALT: 18 U/L (ref 0–35)
AST: 22 U/L (ref 0–37)
Albumin: 4.1 g/dL (ref 3.5–5.2)
Alkaline Phosphatase: 80 U/L (ref 39–117)
BUN: 11 mg/dL (ref 6–23)
CO2: 29 mEq/L (ref 19–32)
Calcium: 9.2 mg/dL (ref 8.4–10.5)
Chloride: 102 mEq/L (ref 96–112)
Creatinine, Ser: 0.71 mg/dL (ref 0.40–1.20)
GFR: 94.55 mL/min (ref >60.00)
Glucose, Bld: 95 mg/dL (ref 70–99)
Potassium: 4.1 mEq/L (ref 3.5–5.1)
Sodium: 137 mEq/L (ref 135–145)
Total Bilirubin: 0.3 mg/dL (ref 0.2–1.2)
Total Protein: 7.1 g/dL (ref 6.0–8.3)

## 2020-05-16 LAB — TSH: TSH: 1.81 u[IU]/mL (ref 0.35–4.50)

## 2020-05-16 LAB — HEMOGLOBIN A1C: Hgb A1c MFr Bld: 6.5 % (ref 4.6–6.5)

## 2020-05-16 LAB — VITAMIN B12: Vitamin B-12: 213 pg/mL (ref 211–911)

## 2020-05-16 MED ORDER — METHYLPREDNISOLONE ACETATE 80 MG/ML IJ SUSP
80.0000 mg | Freq: Once | INTRAMUSCULAR | Status: AC
  Administered 2020-05-16: 80 mg via INTRAMUSCULAR

## 2020-05-16 MED ORDER — GABAPENTIN 100 MG PO CAPS: 100.0000 mg | ORAL_CAPSULE | Freq: Every day | ORAL | 3 refills | Status: DC

## 2020-05-16 NOTE — Patient Instructions (Signed)
It was very nice to see you today!  I think you have a pinched nerve in your back.  We will check blood work to make sure there is nothing else is going on.  Please start gabapentin.  We will give you a cortisone injection today.  We may need to send you to see a specialist or get an MRI of your back depending on your blood work.  Take care, Dr [name]  Please try these tips to maintain a healthy lifestyle:   Eat at least 3 REAL meals and 1-2 snacks per day.  Aim for no more than 5 hours between eating.  If you eat breakfast, please do so within one hour of getting up.    Each meal should contain half fruits/vegetables, one quarter protein, and one quarter carbs (no bigger than a computer mouse)   Cut down on sweet beverages. This includes juice, soda, and sweet tea.     Drink at least 1 glass of water with each meal and aim for at least 8 glasses per day   Exercise at least 150 minutes every week.

## 2020-05-16 NOTE — Assessment & Plan Note (Signed)
No red flags. Symptoms have worsened and are not controlled.  Concern for possible nerve impingement given positive straight leg raise.  Will check labs today to make sure there is nothing else that is contributing.  We will start gabapentin 100mg  daily.  We will give 80 mg Depo-Medrol IM today.  May need imagining or referral depending on labs and response to treatment.

## 2020-05-16 NOTE — Progress Notes (Signed)
   [name] is a 60 y.o. female who presents today for an office visit.  Assessment/Plan:  Chronic Problems Addressed Today: Lumbar radiculopathy No red flags. Symptoms have worsened and are not controlled.  Concern for possible nerve impingement given positive straight leg raise.  Will check labs today to make sure there is nothing else that is contributing.  We will start gabapentin 100mg  daily.  We will give 80 mg Depo-Medrol IM today.  May need imagining or referral depending on labs and response to treatment.      Subjective:  HPI:  Patient here with worsening bilateral leg pain.  Feels like she is walking on eggshells.  She was seen couple months ago for meralgia paresthetica.  Symptoms have worsened since then.  She has been taking diclofenac which helped modestly.  Pain is worse after waking up.  No weakness.  Has some back pain which is manageable.       Objective:  Physical Exam: BP (!) 148/86   Pulse 82   Temp 98.4 F (36.9 C) (Temporal)   Ht 5\' 8"  (1.727 m)   Wt 259 lb 12.8 oz (117.8 kg)   SpO2 95%   BMI 39.50 kg/m   Gen: No acute distress, resting comfortably MSK: Back without deformities.  - Legs: Neurovascularly intact distally.  Positive Tinel's sign at bilateral ankles.  Straight leg raise positive bilaterally.  Strength 5 out of 5 throughout. Neuro: Grossly normal, moves all extremities Psych: Normal affect and thought content      Paola Flynt M. [name], MD 05/16/2020 11:22 AM

## 2020-05-16 NOTE — Addendum Note (Signed)
Addended by: Jerrel Ivory D on: 05/16/2020 11:30 AM   Modules accepted: Orders

## 2020-05-17 NOTE — Progress Notes (Signed)
Please inform patient of the following:  Her B12 is low. I think this could explain osme of her symptoms. Recommend we start B12 replacement protocol her to see if it helps.  Her blood sugar is also borderline diabetic. She does not need to start medications but I would like for her to really focus on diet and exercise and have her come backin 3-6 months to recheck her A1c.  Algis Greenhouse. [name], MD 05/17/2020 8:17 AM

## 2020-06-05 ENCOUNTER — Telehealth: Payer: Self-pay

## 2020-06-05 DIAGNOSIS — M5416 Radiculopathy, lumbar region: Secondary | ICD-10-CM

## 2020-06-05 NOTE — Addendum Note (Signed)
Addended by: Clyde Lundborg A on: 06/05/2020 12:44 PM   Modules accepted: Orders

## 2020-06-05 NOTE — Telephone Encounter (Signed)
Please advise 

## 2020-06-05 NOTE — Telephone Encounter (Signed)
MRI ordered

## 2020-06-05 NOTE — Telephone Encounter (Signed)
Pt is still feeling pain in legs and requesting an MRI that Dr. [name] mentioned. She wanted to see Dr. [name] today, but there are no availabilities.

## 2020-06-05 NOTE — Telephone Encounter (Signed)
Ok to place order for MRI lumbar spine without contrast for lumbar radiculopathy.  [name]. [name], MD 06/05/2020 9:38 AM

## 2020-06-19 ENCOUNTER — Other Ambulatory Visit: Payer: Self-pay

## 2020-06-19 ENCOUNTER — Ambulatory Visit
Admission: RE | Admit: 2020-06-19 | Discharge: 2020-06-19 | Disposition: A | Discharge status: Home / Self Care | Payer: 59 | Source: Ambulatory Visit | Attending: Family Medicine | Admitting: Family Medicine

## 2020-06-19 DIAGNOSIS — M5116 Intervertebral disc disorders with radiculopathy, lumbar region: Secondary | ICD-10-CM | POA: Diagnosis not present

## 2020-06-19 DIAGNOSIS — M5416 Radiculopathy, lumbar region: Secondary | ICD-10-CM

## 2020-06-19 DIAGNOSIS — M48061 Spinal stenosis, lumbar region without neurogenic claudication: Secondary | ICD-10-CM | POA: Diagnosis not present

## 2020-06-20 ENCOUNTER — Other Ambulatory Visit: Payer: Self-pay

## 2020-06-20 DIAGNOSIS — M5416 Radiculopathy, lumbar region: Secondary | ICD-10-CM

## 2020-06-20 NOTE — Progress Notes (Signed)
Please inform patient of the following:  MRi shows degenerative changes in her back and a few areas where her nerves could be pinched or impinged. Recommend we get her in with orthopedics. Please place referral.  Trey Bebee M. [name], MD 06/20/2020 2:44 PM

## 2020-06-29 ENCOUNTER — Ambulatory Visit (INDEPENDENT_AMBULATORY_CARE_PROVIDER_SITE_OTHER): Payer: 59 | Admitting: Family Medicine

## 2020-06-29 ENCOUNTER — Other Ambulatory Visit: Payer: Self-pay

## 2020-06-29 ENCOUNTER — Encounter: Payer: Self-pay | Admitting: Family Medicine

## 2020-06-29 DIAGNOSIS — G5711 Meralgia paresthetica, right lower limb: Secondary | ICD-10-CM

## 2020-06-29 DIAGNOSIS — G8929 Other chronic pain: Secondary | ICD-10-CM

## 2020-06-29 DIAGNOSIS — M545 Low back pain, unspecified: Secondary | ICD-10-CM

## 2020-06-29 DIAGNOSIS — M79672 Pain in left foot: Secondary | ICD-10-CM | POA: Diagnosis not present

## 2020-06-29 NOTE — Progress Notes (Signed)
Office Visit Note   Patient: [name]           Date of Birth: Dec 18, 1960           MRN: [id number] Visit Date: 06/29/2020 Requested by: Vivi Barrack, MD [address] [place],  [place] [address] PCP: Vivi Barrack, MD  Subjective: Chief Complaint  Patient presents with  . Lower Back - Pain    Chronic pain in the lower back x years, with numbness in the lateral right thigh. Occasional sharp pain in the right thigh -- has to sit immediately when this happens. Pain in the left heel (plantar) x months. Constant pain. NKI    HPI: She is here with right lateral thigh pain, low back pain, and left heel pain.  For 2 years she has had numbness in her right lateral thigh without pain.  In the last few months it has become painful, especially when she stands and walks.  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  She has had low back pain as well, but it is not as painful as her lateral thigh.  She had an MRI recently which showed left-sided L3 nerve contact, but no right-sided findings to explain her symptoms.  From limping, her left heel has started to hurt on the plantar aspect.  She does note that she has gained weight in the past couple years.  She does not wear a belt.  Her symptoms improved with sitting, get worse with standing and walking.                ROS:   All other systems were reviewed and are negative.  Objective: Vital Signs: There were no vitals taken for this visit.  Physical Exam:  General:  Alert and oriented, in no acute distress. Pulm:  Breathing unlabored. Psy:  Normal mood, congruent affect  Low back: She does have some tenderness in the L5-S1 area right side and left.  No pain in the sciatic notch.  There is a little bit of tenderness near the ASIS on the right.  No pain with passive internal hip rotation.  Straight leg raise is negative.  Lower extremity strength is normal.  Good circulation. Abdomen:  Protuberant. Left heel: Tender at the medial origin of the plantar fascia.  Tight hamstrings and heel cords.  Imaging: No results found.  Assessment & Plan: 1.  Chronic right lateral thigh pain, suspicious for meralgia paresthetica.  Lumbar MRI showed left-sided findings at L3, but nothing to explain her right-sided symptoms. -We will try physical therapy.  If she fails to improve, could contemplate nerve block of the lateral femoral cutaneous nerve.  2.  Left heel plantar fasciitis -Physical therapy as above.     Procedures: No procedures performed        PMFS History: Patient Active Problem List   Diagnosis Date Noted  . Lumbar radiculopathy 05/16/2020  . Bilateral hip pain 08/20/2019  . Meralgia paresthetica of right side 04/28/2018  . Pleural effusion 04/02/2018  . Elevated blood pressure reading 04/02/2018  . Skin lesion 04/02/2018  . History of bladder cancer 04/02/2018   Past Medical History:  Diagnosis Date  . Bladder cancer [facility])     Family History  Problem Relation Age of Onset  . Stroke Mother   . Diabetes Father     Past Surgical History:  Procedure Laterality Date  . ABDOMINAL HYSTERECTOMY    . BLADDER  SURGERY     bladder cancer   Social History   Occupational History  . Not on file  Tobacco Use  . Smoking status: Former Smoker    Types: Cigarettes    Quit date: 03/18/2017    Years since quitting: 3.2  . Smokeless tobacco: Never Used  Vaping Use  . Vaping Use: Never used  Substance and Sexual Activity  . Alcohol use: Never  . Drug use: Never  . Sexual activity: Not on file

## 2020-07-10 DIAGNOSIS — Z6841 Body Mass Index (BMI) 40.0 and over, adult: Secondary | ICD-10-CM | POA: Diagnosis not present

## 2020-07-10 DIAGNOSIS — Z1231 Encounter for screening mammogram for malignant neoplasm of breast: Secondary | ICD-10-CM | POA: Diagnosis not present

## 2020-07-10 DIAGNOSIS — Z01419 Encounter for gynecological examination (general) (routine) without abnormal findings: Secondary | ICD-10-CM | POA: Diagnosis not present

## 2020-07-10 DIAGNOSIS — N952 Postmenopausal atrophic vaginitis: Secondary | ICD-10-CM | POA: Diagnosis not present

## 2020-07-18 ENCOUNTER — Encounter: Payer: Self-pay | Admitting: Physical Therapy

## 2020-07-18 ENCOUNTER — Ambulatory Visit: Payer: 59 | Admitting: Physical Therapy

## 2020-07-18 ENCOUNTER — Other Ambulatory Visit: Payer: Self-pay

## 2020-07-18 DIAGNOSIS — M25672 Stiffness of left ankle, not elsewhere classified: Secondary | ICD-10-CM

## 2020-07-18 DIAGNOSIS — M5441 Lumbago with sciatica, right side: Secondary | ICD-10-CM | POA: Diagnosis not present

## 2020-07-18 DIAGNOSIS — M6281 Muscle weakness (generalized): Secondary | ICD-10-CM

## 2020-07-18 DIAGNOSIS — M79672 Pain in left foot: Secondary | ICD-10-CM | POA: Diagnosis not present

## 2020-07-18 DIAGNOSIS — G8929 Other chronic pain: Secondary | ICD-10-CM

## 2020-07-18 NOTE — Therapy (Addendum)
[facility] [address] [place], [place], [address] Phone: [phone]   Fax:  [phone]  Physical Therapy Evaluation/ Discharge  Patient Details  Name: [name] MRN: [id number] Date of Birth: June 19, 1960 Referring Provider (PT): Dr Eunice Blase   Encounter Date: 07/18/2020   PT End of Session - 07/18/20 0802     Visit Number 1    Number of Visits 6    Date for PT Re-Evaluation 08/29/20    Authorization Type [facility]    PT Start Time 0805    PT Stop Time 0846    PT Time Calculation (min) 41 min    Activity Tolerance Patient tolerated treatment well    Behavior During Therapy [facility] for tasks assessed/performed             Past Medical History:  Diagnosis Date   Bladder cancer [facility])     Past Surgical History:  Procedure Laterality Date   ABDOMINAL HYSTERECTOMY     BLADDER SURGERY     bladder cancer    There were no vitals filed for this visit.    Subjective Assessment - 07/18/20 0805     Subjective Pt reports she developed Rt side LBP and leg pain about 6 months ago and her Lt heel pain started about the same time.  Her heel is worse than the numbness and buring in the Rt leg. Pain is to the knee. She has been rolling her foot, also tried nice shoes and inserts - not helping. Uses OTC meds as needed.    Diagnostic tests MRI of back    Patient Stated Goals get her heel to feel better and manage the Rt leg numbness/pain    Currently in Pain? Yes    Pain Score 8     Pain Location Heel   at base of Lt heel   Pain Orientation Left    Pain Descriptors / Indicators Dull    Pain Type Chronic pain    Pain Onset More than a month ago    Pain Frequency Constant    Aggravating Factors  when the Rt low back hurts more the Lt heel does too, first thing in AM    Pain Relieving Factors rest    Multiple Pain Sites Yes    Pain Score 6    Pain Location Back    Pain Orientation Right    Pain Descriptors / Indicators Numbness     Pain Onset More than a month ago    Pain Frequency Intermittent    Aggravating Factors  walking    Pain Relieving Factors OTC meds                [facility] PT Assessment - 07/18/20 0001       Assessment   Medical Diagnosis Rt sided LBP and Lt heel pain    Referring Provider (PT) Dr Legrand Como Hilts    Onset Date/Surgical Date 01/19/20    Next MD Visit PRN    Prior Therapy none      Precautions   Precautions None      Balance Screen   Has the patient fallen in the past 6 months No    Has the patient had a decrease in activity level because of a fear of falling?  No    Is the patient reluctant to leave their home because of a fear of falling?  No      Home Ecologist residence  Home Layout Two level   has difficulty with ascending     Prior Function   Level of Independence Independent    Vocation Full time employment    Insurance underwriter    Leisure doesn't do alot besides work      Observation/Other Assessments   Focus on Therapeutic Outcomes (FOTO)  39      Posture/Postural Control   Posture/Postural Control Postural limitations    Postural Limitations Rounded Shoulders;Forward head;Decreased lumbar lordosis      ROM / Strength   AROM / PROM / Strength AROM;Strength      AROM   AROM Assessment Site Hip;Ankle;Lumbar    Right/Left Ankle Left;Right    Right Ankle Dorsiflexion 9   PROM 18   Left Ankle Dorsiflexion 6   PROM 8   Lumbar Flexion 4" from floor with tightness in low back    Lumbar Extension 25% present with LBP    Lumbar - Right Rotation 25% present with pain    Lumbar - Left Rotation 75% present      Strength   Strength Assessment Site Hip;Knee;Ankle    Right/Left Hip --   flex 5/5, abduction Rt 3/5 withpain, Lt 4/5, extension Rt 4/5, Lt 4+/5   Right/Left Knee --   5/5   Right/Left Ankle --   5/5     Flexibility   Soft Tissue Assessment /Muscle Length yes    Hamstrings slight tightness     Quadriceps bilat tight    ITB Rt very  tight    Piriformis slight tightness      Palpation   Spinal mobility good lumbar and sacral CPA and UPA mobs    Palpation comment tender in Rt ITB and TFL      Special Tests   Other special tests (-) slump and SLR bilat                        Objective measurements completed on examination: See above findings.       [facility] PT Treatment/Exercise - 07/18/20 0001       Exercises   Exercises Other Exercises    Other Exercises  SKTC, ITB stretch with band in supine, LTR and gastroc stretch      Modalities   Modalities Iontophoresis      Iontophoresis   Type of Iontophoresis Dexamethasone    Location lateral Lt heel    Dose 36mpm patch    Time 4-6 hrs                         PT Long Term Goals - 07/18/20 1030       PT LONG TERM GOAL #1   Title I with advacned HEP    Time 6    Period Weeks    Status New    Target Date 08/29/20      PT LONG TERM GOAL #2   Title improve FOTO =/> 50    Time 6    Period Weeks    Status New    Target Date 08/29/20      PT LONG TERM GOAL #3   Title report =/> 75% reduction in Lt heel pain upon getting up and Rt thigh symptoms with walking    Time 6    Period Weeks    Status New    Target Date 08/29/20      PT LONG TERM GOAL #4   Title  improve Lt ankle dorsiflexion =/> 12 to allow for tibial transition over foot during stance and toe off    Time 6    Period Weeks    Status New    Target Date 08/29/20      PT LONG TERM GOAL #5   Title improve bilat hip strength =/> 5-/5 to support upright posture    Time 6    Period Weeks    Status New    Target Date 08/29/20                    Plan - 07/18/20 1024     Clinical Impression Statement 60 yo female presents with 62monthor more h/o Rt low back/thigh pain and numbness and Lt heel pain.  She has painfull limited lumbar ROM and decrease ankle dorsiflexion.  Increased pain with ambulation and  first thing in the morning.  She has core and hip weakness bilat Rt > Lt and some muscular tightness and tenderness along the Rt ITB and TFL.  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 She does have good sneakers, no inserts for her low arches however has tried them in the past.  She has not sought tx until now and should benefit from PT to decrease pain and restore function.  She requests once a week due to work schedule and having to watch her grandchild after school.    Personal Factors and Comorbidities Comorbidity 3+    Comorbidities refer to snapshot    Examination-Activity Limitations Locomotion Level;Bend;Caring for Others;Stairs    Examination-Participation Restrictions Occupation;Other    Stability/Clinical Decision Making Stable/Uncomplicated    Clinical Decision Making Low    Rehab Potential Good    PT Frequency 1x / week    PT Duration 6 weeks    PT Treatment/Interventions Iontophoresis 423mml Dexamethasone;Taping;Vasopneumatic Device;Patient/family education;Functional mobility training;Moist Heat;Ultrasound;Passive range of motion;Dry needling;Therapeutic exercise;Cryotherapy;Electrical Stimulation;Neuromuscular re-education;Manual techniques    PT Next Visit Plan manual work to Rt ITB and Lt plantarfascia, stretching, assess response to ionto and continue    PT Home Exercise Plan [email or web address]    Consulted and Agree with Plan of Care Patient             Patient will benefit from skilled therapeutic intervention in order to improve the following deficits and impairments:  Decreased range of motion,Difficulty walking,Obesity,Increased muscle spasms,Pain,Impaired flexibility,Postural dysfunction,Decreased strength  Visit Diagnosis: Chronic right-sided low back pain with right-sided sciatica - Plan: PT plan of care cert/re-cert  Pain in left foot - Plan: PT plan of care cert/re-cert  Muscle weakness (generalized) - Plan: PT plan of care  cert/re-cert  Stiffness of left ankle, not elsewhere classified - Plan: PT plan of care cert/re-cert     Problem List Patient Active Problem List   Diagnosis Date Noted   Lumbar radiculopathy 05/16/2020   Bilateral hip pain 08/20/2019   Meralgia paresthetica of right side 04/28/2018   Pleural effusion 04/02/2018   Elevated blood pressure reading 04/02/2018   Skin lesion 04/02/2018   History of bladder cancer 04/02/2018    SuJeral PinchT 07/18/2020, 10:34 AM  PHYSICAL THERAPY DISCHARGE SUMMARY  Visits from Start of Care: 1  Current functional level related to goals / functional outcomes: See note   Remaining deficits: See note   Education / Equipment: HEP   Patient agrees to discharge. Patient goals were not met. Patient is being discharged due to not returning since the last visit.  Scot Jun, PT, DPT, OCS, ATC 09/12/20  1:44 PM     [facility] [address] [place], [place], [address] Phone: [phone]   Fax:  [phone]  Name: [name] MRN: [id number] Date of Birth: 1960-11-28

## 2020-07-18 NOTE — Patient Instructions (Signed)
Access Code: [email or web address] URL: [email or web address] Date: 07/18/2020 Prepared by: Jeral Pinch  Exercises Supine Single Knee to Chest Stretch - 2 x daily - 2 reps - 30sec hold Supine Lower Trunk Rotation - 2 x daily - 2 reps - 10 sec hold Supine ITB Stretch with Strap - 2 x daily - 2 reps - 30 sec hold Gastroc Stretch on Wall - 2 x daily - 2 reps - 30 sec hold  Patient Education [place] Patient Instructions

## 2020-08-02 ENCOUNTER — Telehealth: Payer: Self-pay | Admitting: Rehabilitative and Restorative Service Providers"

## 2020-08-02 ENCOUNTER — Encounter: Payer: 59 | Admitting: Rehabilitative and Restorative Service Providers"

## 2020-08-02 NOTE — Telephone Encounter (Signed)
Called Pt. After 15 mins no show for appointment and left message with reminder of next appointment time.   Scot Jun, PT, DPT, OCS, ATC 08/02/20  8:20 AM

## 2020-08-09 ENCOUNTER — Telehealth: Payer: Self-pay | Admitting: Rehabilitative and Restorative Service Providers"

## 2020-08-09 ENCOUNTER — Encounter: Payer: 59 | Admitting: Rehabilitative and Restorative Service Providers"

## 2020-08-09 NOTE — Telephone Encounter (Signed)
2nd no show in a row.  Called and left voice message.  One more appointment scheduled and reminded through message about the time.    Scot Jun, PT, DPT, OCS, ATC 08/09/20  8:19 AM

## 2020-08-11 ENCOUNTER — Other Ambulatory Visit (HOSPITAL_COMMUNITY): Payer: Self-pay

## 2020-08-16 ENCOUNTER — Encounter: Payer: 59 | Admitting: Rehabilitative and Restorative Service Providers"

## 2020-08-19 ENCOUNTER — Other Ambulatory Visit (HOSPITAL_COMMUNITY): Payer: Self-pay

## 2020-08-19 MED FILL — Valacyclovir HCl Tab 1 GM: ORAL | 14 days supply | Qty: 42 | Fill #0 | Status: AC

## 2020-11-15 ENCOUNTER — Emergency Department (HOSPITAL_BASED_OUTPATIENT_CLINIC_OR_DEPARTMENT_OTHER): Payer: 59

## 2020-11-15 ENCOUNTER — Emergency Department (HOSPITAL_BASED_OUTPATIENT_CLINIC_OR_DEPARTMENT_OTHER)
Admission: EM | Admit: 2020-11-15 | Discharge: 2020-11-15 | Disposition: A | Discharge status: Home / Self Care | Payer: 59 | Attending: Emergency Medicine | Admitting: Emergency Medicine

## 2020-11-15 ENCOUNTER — Encounter (HOSPITAL_BASED_OUTPATIENT_CLINIC_OR_DEPARTMENT_OTHER): Payer: Self-pay

## 2020-11-15 ENCOUNTER — Other Ambulatory Visit: Payer: Self-pay

## 2020-11-15 DIAGNOSIS — Z20822 Contact with and (suspected) exposure to covid-19: Secondary | ICD-10-CM | POA: Diagnosis not present

## 2020-11-15 DIAGNOSIS — M25476 Effusion, unspecified foot: Secondary | ICD-10-CM | POA: Diagnosis not present

## 2020-11-15 DIAGNOSIS — R0902 Hypoxemia: Secondary | ICD-10-CM | POA: Diagnosis not present

## 2020-11-15 DIAGNOSIS — Z8551 Personal history of malignant neoplasm of bladder: Secondary | ICD-10-CM | POA: Diagnosis not present

## 2020-11-15 DIAGNOSIS — J069 Acute upper respiratory infection, unspecified: Secondary | ICD-10-CM | POA: Diagnosis not present

## 2020-11-15 DIAGNOSIS — Z87891 Personal history of nicotine dependence: Secondary | ICD-10-CM | POA: Insufficient documentation

## 2020-11-15 DIAGNOSIS — R059 Cough, unspecified: Secondary | ICD-10-CM | POA: Diagnosis present

## 2020-11-15 DIAGNOSIS — B9789 Other viral agents as the cause of diseases classified elsewhere: Secondary | ICD-10-CM | POA: Diagnosis not present

## 2020-11-15 MED ORDER — OFLOXACIN 0.3 % OT SOLN: 5.0000 [drp] | Freq: Two times a day (BID) | OTIC | 0 refills | Status: DC

## 2020-11-15 MED ORDER — DOXYCYCLINE HYCLATE 100 MG PO CAPS: 100.0000 mg | ORAL_CAPSULE | Freq: Two times a day (BID) | ORAL | 0 refills | Status: DC

## 2020-11-15 NOTE — ED Notes (Signed)
ED Provider at bedside. 

## 2020-11-15 NOTE — ED Notes (Signed)
Patient ambulated with pulse ox. SAT 93-94%

## 2020-11-15 NOTE — Discharge Instructions (Addendum)
Please return for worsening difficulty breathing.  Follow up with your doctor in the office.

## 2020-11-15 NOTE — ED Triage Notes (Addendum)
Pt c/o flu like sx x 1 month-seen/sent from [facility] today for low sats-NAD-steady gait

## 2020-11-15 NOTE — ED Provider Notes (Addendum)
[facility] EMERGENCY DEPARTMENT Provider Note   CSN: [id number] Arrival date & time: 11/15/20  1627     History Chief Complaint  Patient presents with   Cough    [name] is a 60 y.o. female.  60 yo F with a chief complaints of cough and fever.  This been going on for about a month now.  She was recently in [place].  Denies any known sick contacts.  She went to urgent care today and they were concerned about her oxygen saturation and sent her here for evaluation.  She had a chest x-ray at that time that reportedly had no focal infiltrates.  Patient feels like she needs an antibiotic because her symptoms have not improved.  Has persistent cough and now having low-grade fevers.  She denies any chest pain denies feeling like she might pass out denies lower extremity edema denies nausea vomiting or diarrhea.  The history is provided by the patient.  Cough Associated symptoms: fever   Associated symptoms: no chest pain, no chills, no headaches, no myalgias, no rhinorrhea, no shortness of breath and no wheezing   Illness Severity:  Moderate Onset quality:  Gradual Duration:  1 month Timing:  Constant Progression:  Unchanged Chronicity:  New Associated symptoms: cough and fever   Associated symptoms: no chest pain, no congestion, no headaches, no myalgias, no nausea, no rhinorrhea, no shortness of breath, no vomiting and no wheezing       Past Medical History:  Diagnosis Date   Bladder cancer [facility])     Patient Active Problem List   Diagnosis Date Noted   Lumbar radiculopathy 05/16/2020   Bilateral hip pain 08/20/2019   Meralgia paresthetica of right side 04/28/2018   Pleural effusion 04/02/2018   Elevated blood pressure reading 04/02/2018   Skin lesion 04/02/2018   History of bladder cancer 04/02/2018    Past Surgical History:  Procedure Laterality Date   ABDOMINAL HYSTERECTOMY     BLADDER SURGERY     bladder cancer     OB History   No obstetric  history on file.     Family History  Problem Relation Age of Onset   Stroke Mother    Diabetes Father     Social History   Tobacco Use   Smoking status: Former    Types: Cigarettes    Quit date: 03/18/2017    Years since quitting: 3.6   Smokeless tobacco: Never  Vaping Use   Vaping Use: Never used  Substance Use Topics   Alcohol use: Never   Drug use: Never    Home Medications Prior to Admission medications   Medication Sig Start Date End Date Taking? Authorizing Provider  doxycycline (VIBRAMYCIN) 100 MG capsule Take 1 capsule (100 mg total) by mouth 2 (two) times daily. One po bid x 7 days 11/15/20  Yes Deno Etienne, DO  diclofenac (VOLTAREN) 75 MG EC tablet TAKE 1 TABLET BY MOUTH 2 TIMES DAILY Patient not taking: No sig reported 03/01/20 03/01/21  Vivi Barrack, MD  gabapentin (NEURONTIN) 100 MG capsule Take 1 capsule (100 mg total) by mouth at bedtime. Patient not taking: No sig reported 05/16/20   Vivi Barrack, MD  valACYclovir (VALTREX) 1000 MG tablet TAKE 1 TABLET BY MOUTH THREE TIMES DAILY FOR 14 DAYS 12/24/19 12/23/20  Oliver Barre, MD    Allergies    Patient has no known allergies.  Review of Systems   Review of Systems  Constitutional:  Positive for fever. Negative  for chills.  HENT:  Negative for congestion and rhinorrhea.   Eyes:  Negative for redness and visual disturbance.  Respiratory:  Positive for cough. Negative for shortness of breath and wheezing.   Cardiovascular:  Negative for chest pain and palpitations.  Gastrointestinal:  Negative for nausea and vomiting.  Genitourinary:  Negative for dysuria and urgency.  Musculoskeletal:  Negative for arthralgias and myalgias.  Skin:  Negative for pallor and wound.  Neurological:  Negative for dizziness and headaches.   Physical Exam Updated Vital Signs BP (!) 144/74 (BP Location: Right Arm)   Pulse 97   Temp 99.6 F (37.6 C) (Oral)   Resp 18   Ht 5' 6.14" (1.68 m)   Wt 114.8 kg   SpO2 91%   BMI  40.66 kg/m   Physical Exam Vitals and nursing note reviewed.  Constitutional:      General: She is not in acute distress.    Appearance: She is well-developed. She is not diaphoretic.     Comments: BMI 41  HENT:     Head: Normocephalic and atraumatic.     Comments: Right TM with some chronic appearing changes.  No effusion no bulging no erythema.  No noted canal edema or discharge. Eyes:     Pupils: Pupils are equal, round, and reactive to light.  Cardiovascular:     Rate and Rhythm: Normal rate and regular rhythm.     Heart sounds: No murmur heard.   No friction rub. No gallop.  Pulmonary:     Effort: Pulmonary effort is normal.     Breath sounds: No wheezing or rales.  Abdominal:     General: There is no distension.     Palpations: Abdomen is soft.     Tenderness: There is no abdominal tenderness.  Musculoskeletal:        General: No tenderness.     Cervical back: Normal range of motion and neck supple.  Skin:    General: Skin is warm and dry.  Neurological:     Mental Status: She is alert and oriented to person, place, and time.  Psychiatric:        Behavior: Behavior normal.    ED Results / Procedures / Treatments   Labs (all labs ordered are listed, but only abnormal results are displayed) Labs Reviewed - No data to display  EKG None  Radiology No results found.  Procedures Procedures   Medications Ordered in ED Medications - No data to display  ED Course  I have reviewed the triage vital signs and the nursing notes.  Pertinent labs & imaging results that were available during my care of the patient were reviewed by me and considered in my medical decision making (see chart for details).    MDM Rules/Calculators/A&P                           60 yo F with a chief complaints of cough and fever.  Going on for a month now.  Feels like she needs an antibiotic to take care of this.  No known antibiotic allergies.  Was sent here by urgent care with concern  for hypoxia.  Patient is no tachypnea has clear lung sounds.  Chest x-ray was ordered but the patient is declining since she had 1 done at urgent care.  She feels like she is fine and will improve with antibiotics.  Encouraged her to return at any time.  Discussed limitations of not  being able to view the x-ray imaging.  Patient is also requesting antibiotic eardrops.  5:11 PM:  I have discussed the diagnosis/risks/treatment options with the patient and believe the pt to be eligible for discharge home to follow-up with PCP. We also discussed returning to the ED immediately if new or worsening sx occur. We discussed the sx which are most concerning (e.g., sudden worsening sob, syncope,  inability to tolerate by mouth) that necessitate immediate return. Medications administered to the patient during their visit and any new prescriptions provided to the patient are listed below.  Medications given during this visit Medications - No data to display   The patient appears reasonably screen and/or stabilized for discharge and I doubt any other medical condition or other [facility] requiring further screening, evaluation, or treatment in the ED at this time prior to discharge.   Final Clinical Impression(s) / ED Diagnoses Final diagnoses:  Viral URI with cough    Rx / DC Orders ED Discharge Orders          Ordered    doxycycline (VIBRAMYCIN) 100 MG capsule  2 times daily        11/15/20 1708             Deno Etienne, DO 11/15/20 Collins, [place], DO 11/15/20 1744

## 2020-11-16 ENCOUNTER — Telehealth: Payer: Self-pay

## 2020-11-16 NOTE — Telephone Encounter (Signed)
FYI

## 2020-11-16 NOTE — Telephone Encounter (Signed)
Patient called in this morning to request a follow up appointment from being seen at the ED on 8/31.  Patient informed the scheduler that she had cough and fever.    Stated she had tested for COVID at [facility] on 8/31 and was negative.  After consulting with clinical lead, patient was advised that we would need to start out with a virtual visit first.    Patient refused to see any other provider other than Dr. [name].  Patient was offered Dr. Ellwood Handler next available virtual slot on 9/9.  Patient became angry with the scheduler.    I spoke with patient explaining to her that Dr. [name] was currently out of the office.  Therefore his schedule starting back for next week was full.  Patient still refused to see any other provider.    Patient also denied having any fever at all. Patient stated she wanted a in office appt with Dr. [name] and not any other provider.    I offered patient a same day slot for 9/12 and informed her she would need to be fever free to come into the office.    Patient was still upset.  She started yelling at me asking me who I thought I was.    I informed patient that since our conversation was not going to progress any further that I would need to disconnect.

## 2020-11-17 ENCOUNTER — Other Ambulatory Visit: Payer: Self-pay

## 2020-11-17 ENCOUNTER — Ambulatory Visit: Payer: 59 | Admitting: Family Medicine

## 2020-11-17 ENCOUNTER — Encounter: Payer: Self-pay | Admitting: Family Medicine

## 2020-11-17 VITALS — BP 116/80 | HR 87 | Temp 97.6°F | Ht 66.14 in | Wt 251.8 lb

## 2020-11-17 DIAGNOSIS — R052 Subacute cough: Secondary | ICD-10-CM | POA: Diagnosis not present

## 2020-11-17 NOTE — Telephone Encounter (Signed)
Called patient back and scheduled her with Dr. Yong Channel for ED follow up. Pt still have cough, but tested negative for Covid, no fever since 8/31.

## 2020-11-17 NOTE — Progress Notes (Addendum)
Phone [phone] In person visit   Subjective:   [name] is a 60 y.o. year old very pleasant female patient who presents for/with See problem oriented charting Chief Complaint  Patient presents with   Follow-up    ED visit; Cough/fever. She was given Doxycycline and an antibiotic ear drop which has helped. Today she denies fever, Chest Pain, SOB, dizziness, or headache.     This visit occurred during the SARS-CoV-2 public health emergency.  Safety protocols were in place, including screening questions prior to the visit, additional usage of staff PPE, and extensive cleaning of exam room while observing appropriate contact time as indicated for disinfecting solutions.   Past Medical History-  Patient Active Problem List   Diagnosis Date Noted   Lumbar radiculopathy 05/16/2020   Bilateral hip pain 08/20/2019   Meralgia paresthetica of right side 04/28/2018   Pleural effusion 04/02/2018   Elevated blood pressure reading 04/02/2018   Skin lesion 04/02/2018   History of bladder cancer 04/02/2018    Medications- reviewed and updated Current Outpatient Medications  Medication Sig Dispense Refill   doxycycline (VIBRAMYCIN) 100 MG capsule Take 1 capsule (100 mg total) by mouth 2 (two) times daily. One po bid x 7 days 14 capsule 0   ofloxacin (FLOXIN) 0.3 % OTIC solution Place 5 drops into the right ear 2 (two) times daily. 5 mL 0   diclofenac (VOLTAREN) 75 MG EC tablet TAKE 1 TABLET BY MOUTH 2 TIMES DAILY (Patient not taking: No sig reported) 30 tablet 0   gabapentin (NEURONTIN) 100 MG capsule Take 1 capsule (100 mg total) by mouth at bedtime. (Patient not taking: No sig reported) 90 capsule 3   valACYclovir (VALTREX) 1000 MG tablet TAKE 1 TABLET BY MOUTH THREE TIMES DAILY FOR 14 DAYS (Patient not taking: Reported on 11/17/2020) 42 tablet 1   No current facility-administered medications for this visit.     Objective:  BP 116/80   Pulse 87   Temp 97.6 F (36.4 C)  (Temporal)   Ht 5' 6.14" (1.68 m)   Wt 251 lb 12.8 oz (114.2 kg)   SpO2 93%   BMI 40.47 kg/m  Gen: NAD, resting comfortably Pharynx with very mild erythema and some signs of drainage, nasal turbinates slightly edematous with some clear drainage, right tympanic membrane with some scarring, left tympanic membranes normal but your canal more narrow on that side. CV: RRR no murmurs rubs or gallops Lungs: CTAB no crackles, wheeze, rhonchi Abdomen: soft/nontender/nondistended/normal bowel sounds.  Ext: minimal  edema Skin: warm, dry     Assessment and Plan   # ED visit for cough S:Patient was seen at [facility] ED on 11/15/2020 for an evaluation of cough. She stated its been going on for a month now and was recently in [place]. She denied any known sick contacts. She went to urgent care 11/15/2020 and they were concerned about her oxygen saturation and sent her to [facility]. She had x-ray at urgent care so declined in ED (oxygen saturations were normal) at that time and reported no focal infiltrates.  Patient had done a rapid test at home for COVID which was negative.  She also got a call from the urgent care stating her PCR test was negative.  Patient felt like she needed an antibiotic because her symptoms had not improved. Her persistent cough and now had low-graded fevers. She denied any chest pain, syncope, LE edema, nausea, vomiting or diarrhea.  Was diagnosed as viral URI- but I presume  thought was bacterial superinfection given use of doxycycline.   Today patient reports no more fevers since 11/15/20. Cough is improving but not resolved. Right ear is feeling better- they had noted some chronic appearing changes on exam.  Still some fullness in left ear and muffled sounds.   Oxygen today at 93% but patient has had oxygen levels at this range in the past including 08/20/2019.   Apparently years ago had an effusion but reports was not told any issues on x-ray.  Apparently urgent care had  mention she would potentially need troponin levels drawn-patient was not having any shortness of breath and the emergency room provider did not think this was necessary.  Patient denies chest pain or shortness of breath at this time or while she was in the emergency room. A/P: 60 year old female with cough for over a month  (subacute cough at this point) at this point with reported reassuring x-ray from urgent care (we will try to get records) who is having mild improvement on 7-day course of doxycycline-I encouraged her to complete this course in case you have a bacterial superinfection after prior viral illness-she will let us know if not improving in the next week or so. - I also wonder about allergies contributing to this-recommended she take Claritin or Allegra before bed over the next 2 weeks -I also wonder about postviral cough and strongly consider course of prednisone-we will let her complete the antibiotics and trial antihistamines first-prednisone would also help with allergies - No history of asthma-that is lower on differential - Could have an element of acid reflux-could trial a PPI if fails to improve with above steps -if fullness in left ear does not improve- she will let us know as well- would encourage repeat exam- she is using ofloxacin in both ears- I do not see a clear otitis externa at present though -no hematemesis and CXR reassuring- states no high risk exposure including in [place]- doubt TB   Recommended follow up: Return if symptoms worsen or fail to improve.  Time Spent: 21 minutes of total time (1:01 PM- 1:22 PM) was spent on the date of the encounter performing the following actions: chart review prior to seeing the patient, obtaining history, performing a medically necessary exam, counseling on the treatment plan,  and documenting in our EHR.    I,Jada Bradford,acting as a scribe for Garret Reddish, MD.,have documented all relevant documentation on the behalf of Garret Reddish, MD,as directed by  Garret Reddish, MD while in the presence of Garret Reddish, MD.  I, Garret Reddish, MD, have reviewed all documentation for this visit. The documentation on 11/17/20 for the exam, diagnosis, procedures, and orders are all accurate and complete.  Return precautions advised. Garret Reddish, MD

## 2020-11-17 NOTE — Patient Instructions (Addendum)
  Sign release of information at the check out desk for urgent care records, including chest x-ray.   Try Allegra or Claritin for the next two weeks.    Recommended follow up: Return if symptoms worsen or fail to improve.

## 2020-11-17 NOTE — Telephone Encounter (Signed)
Patient called in requesting an appointment for an Ed follow up. Advised patient that we are waiting on a response from Dr.Parker to advise where to schedule patient. Patient verbalized okay and will wait for a phone cal.

## 2021-05-28 IMAGING — MR MR LUMBAR SPINE W/O CM
4 of 5 series · 19 of 48 positions shown · non-contrast
Comparison: CT renal protocol study 03/31/2018.

CLINICAL DATA: Bilateral leg pain. Numbness. Lumbar radiculopathy.

EXAM:
MRI LUMBAR SPINE WITHOUT CONTRAST
TECHNIQUE: Multiplanar, multisequence MR imaging of the lumbar spine was
performed. No intravenous contrast was administered.

[Series 5: T2 · sagittal · 4.0mm · 0.73mm/px · 8 of 15 slices shown (1 of 2)]
[im 1/15]
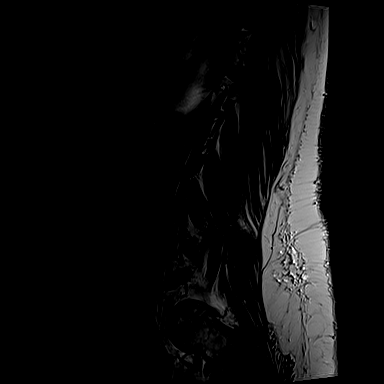
[im 3/15]
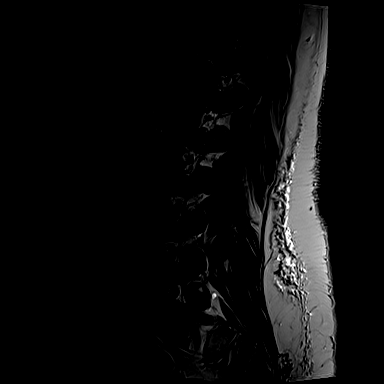
[im 5/15]
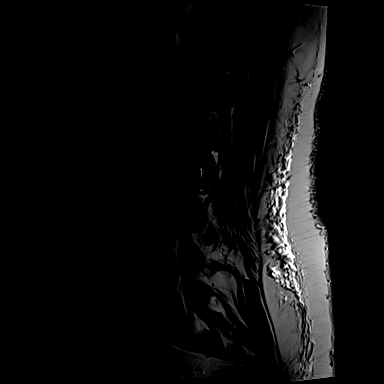
[im 7/15]
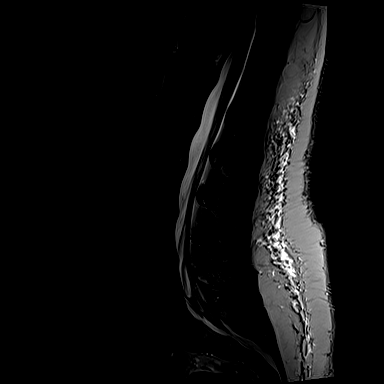
[im 9/15]
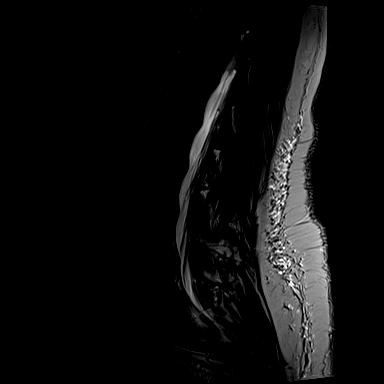
[im 11/15]
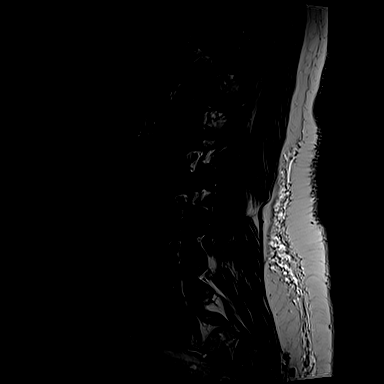
[im 13/15]
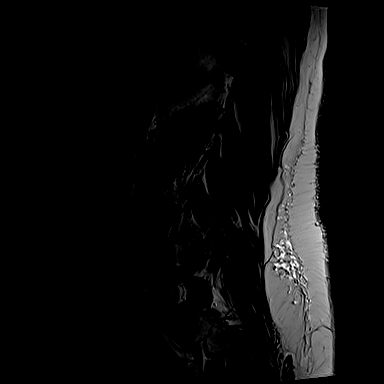
[im 15/15]
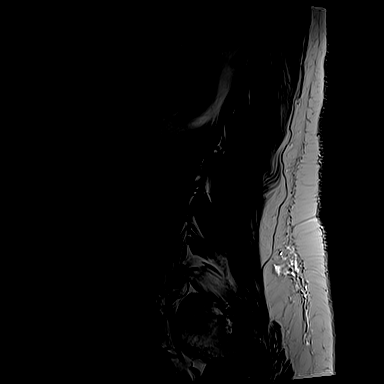

[Series 6: T1 · sagittal · 4.0mm · 0.73mm/px · 3 of 15 slices shown (1 of 2)]
[im 3/15]
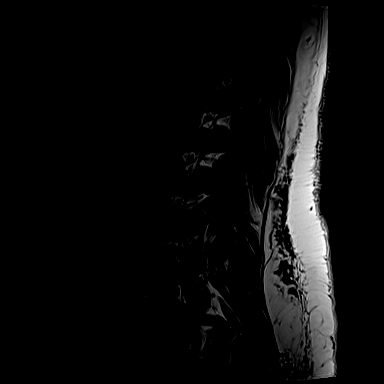
[im 9/15]
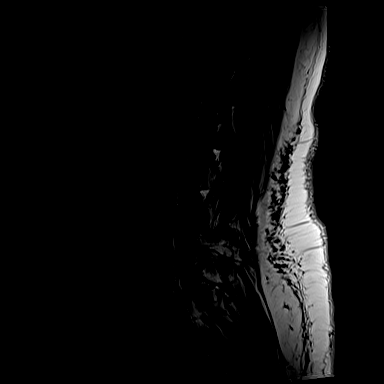
[im 13/15]
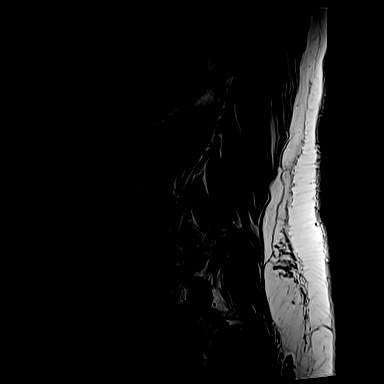

[Series 11: T2 · axial · 4.0mm · 0.28mm/px · z∈[-89,+105]mm · 7 of 45 slices shown (2 of 2)]
[im 3/45]
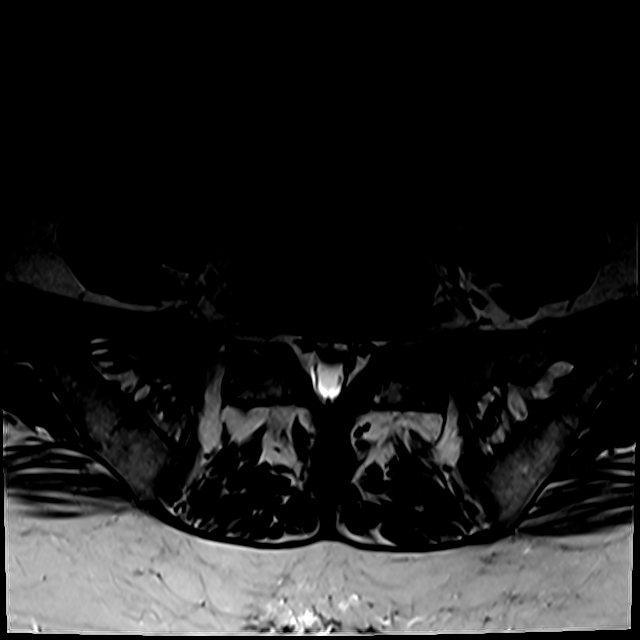
[im 7/45]
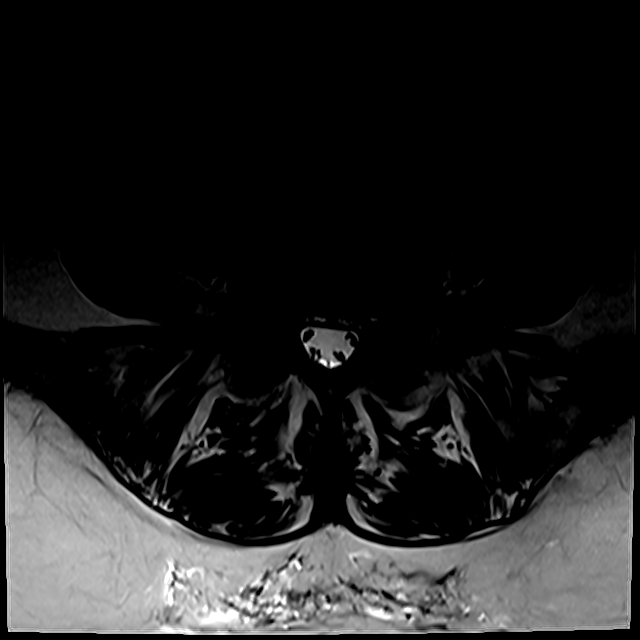
[im 9/45]
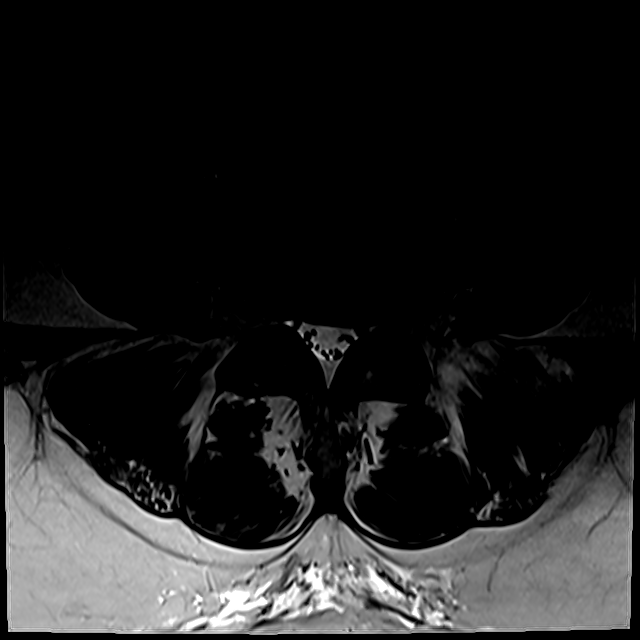
[im 15/45]
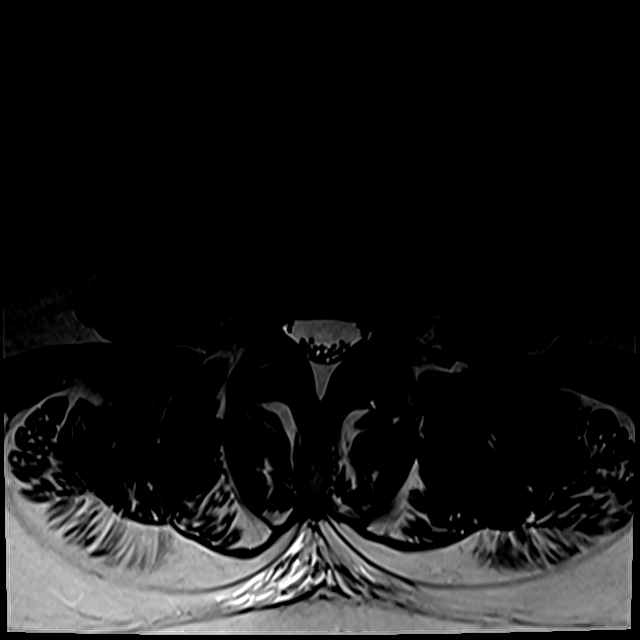
[im 21/45]
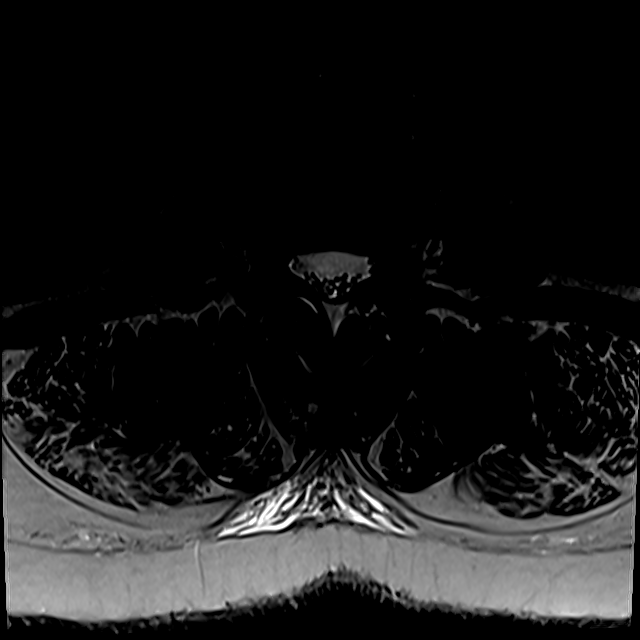
[im 23/45]
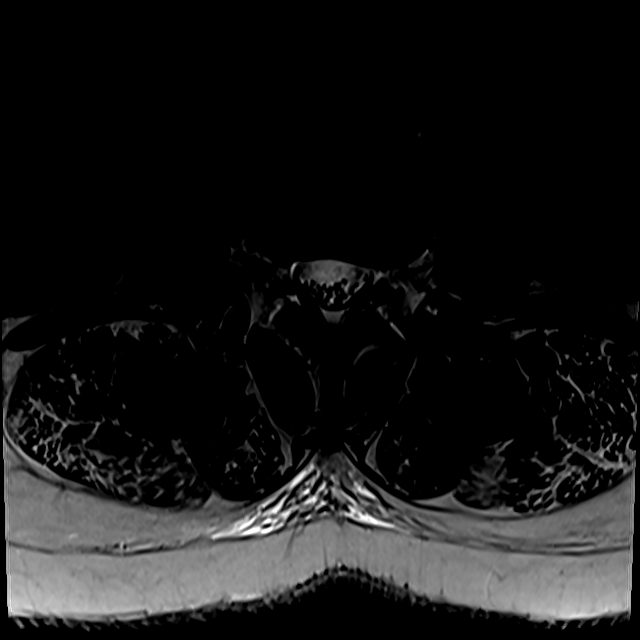
[im 39/45]
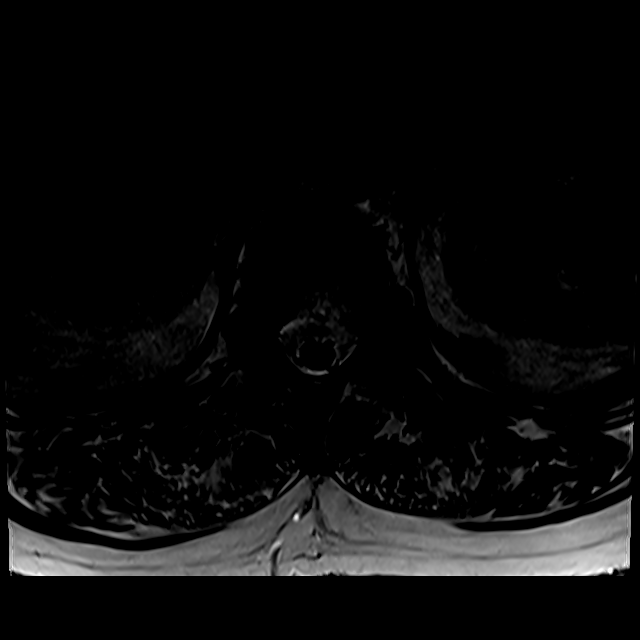

[Series 100: T1 · axial · 4.0mm · 0.28mm/px · 1 of 2 slices shown (2 of 2)]
[im 1/2]
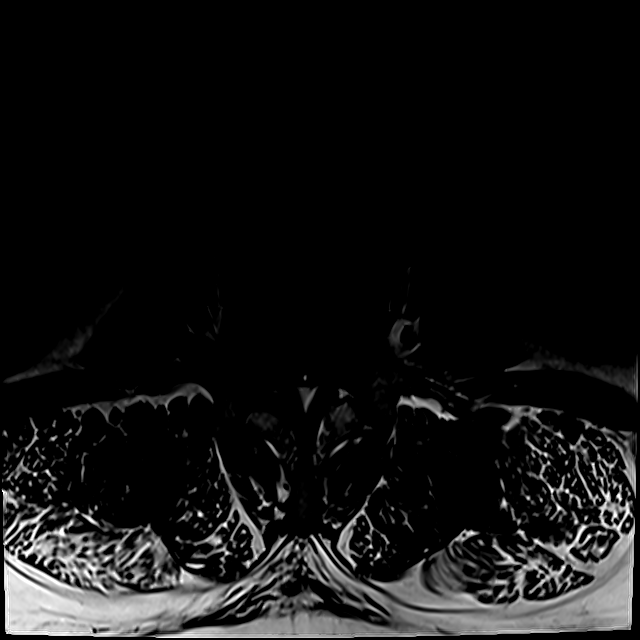

[19 of 48 positions shown; findings below may reference images not displayed]

FINDINGS: Segmentation: Standard segmentation is assumed.

Alignment: No substantial sagittal subluxation. Mild levocurvature.

Vertebrae: Degenerative endplate signal changes about the T11-T12
disc. Vertebral body heights are maintained. No specific evidence of
acute fracture, discitis/osteomyelitis, or suspicious bone lesion.

Conus medullaris and cauda equina: Conus extends to the level. Conus
and cauda equina appear normal.

Paraspinal and other soft tissues: Unremarkable

Disc levels:

T11-T12: Facet hypertrophy and small left foraminal/subarticular
disc protrusion. Mild left foraminal and subarticular recess
stenosis. No significant central canal or right foraminal stenosis.

T12-L1: No significant canal or foraminal stenosis.

L1-L2: No significant disc protrusion, foraminal stenosis, or canal
stenosis. Degenerative mineralization within the posterior
intervertebral disc, also apparent on prior CT renal protocol study.

L2-L3: Mild disc bulging and facet hypertrophy without significant
canal or foraminal stenosis.

L3-L4: Mild broad disc bulge with superimposed left foraminal disc
protrusion. No significant foraminal stenosis. Far
lateral/extraforaminal disc likely contacts the exiting left L3
nerve (series 11, image 30) without evidence of impingement. No
significant canal stenosis.

L4-L5: Mild broad disc bulge with small superimposed left
subarticular disc protrusion with annular fissure. Resulting mild
canal and left greater than right subarticular recess stenosis with
disc contacting the descending L5 nerve roots bilaterally. No
significant foraminal stenosis.

L5-S1: Mild disc bulge without significant canal or foraminal
stenosis.
IMPRESSION: 1. Mild canal and left greater than right subarticular recess
stenosis at L4-L5 with disc contacting the descending L5 nerve roots
bilaterally
2. Far lateral/extraforaminal left disc protrusion at L3-L4 likely
contacts the exiting/exited left L3 nerve without evidence of
impingement.
3. Mild left foraminal stenosis at T11-T12.

## 2021-08-16 ENCOUNTER — Ambulatory Visit (INDEPENDENT_AMBULATORY_CARE_PROVIDER_SITE_OTHER): Payer: BC Managed Care – PPO | Admitting: Family Medicine

## 2021-08-16 ENCOUNTER — Encounter: Payer: Self-pay | Admitting: Family Medicine

## 2021-08-16 VITALS — BP 180/110 | HR 83 | Temp 97.6°F | Resp 17 | Ht 68.0 in | Wt 265.0 lb

## 2021-08-16 DIAGNOSIS — M5416 Radiculopathy, lumbar region: Secondary | ICD-10-CM | POA: Diagnosis not present

## 2021-08-16 DIAGNOSIS — M48061 Spinal stenosis, lumbar region without neurogenic claudication: Secondary | ICD-10-CM | POA: Diagnosis not present

## 2021-08-16 DIAGNOSIS — R030 Elevated blood-pressure reading, without diagnosis of hypertension: Secondary | ICD-10-CM

## 2021-08-16 MED ORDER — GABAPENTIN 300 MG PO CAPS: 300.0000 mg | ORAL_CAPSULE | Freq: Every day | ORAL | 3 refills | Status: DC

## 2021-08-16 MED ORDER — GABAPENTIN 300 MG PO CAPS: 300.0000 mg | ORAL_CAPSULE | Freq: Three times a day (TID) | ORAL | 3 refills | Status: DC

## 2021-08-16 NOTE — Assessment & Plan Note (Signed)
Elevated in setting of acute pain though she has typically been well controlled.  We will continue home monitoring and she will let me know if persistently elevated.  Hopefully should have some improvement as we treat her pain.

## 2021-08-16 NOTE — Assessment & Plan Note (Signed)
Patient's MRI last year showed lumbar canal stenosis in addition to several areas of nerve impingement.  She is very reluctant to take any medications or try injections at this point however symptoms have progressed to the point where she is more open to trying this now.  We will start gabapentin 300 mg nightly and refer her to see a neurosurgeon.

## 2021-08-16 NOTE — Patient Instructions (Addendum)
It was very nice to see you today!  You have several areas of pinched nerves in your back.  I will refer you to see a neurosurgeon.   Please take the gabapentin '300mg'$  at night.  Please talk with your HR department about getting FMLA.   Take care, Dr [name]  PLEASE NOTE:  If you had any lab tests please let us know if you have not heard back within a few days. You may see your results on mychart before we have a chance to review them but we will give you a call once they are reviewed by [place]. If we ordered any referrals today, please let us know if you have not heard from their office within the next week.   Please try these tips to maintain a healthy lifestyle:  Eat at least 3 REAL meals and 1-2 snacks per day.  Aim for no more than 5 hours between eating.  If you eat breakfast, please do so within one hour of getting up.   Each meal should contain half fruits/vegetables, one quarter protein, and one quarter carbs (no bigger than a computer mouse)  Cut down on sweet beverages. This includes juice, soda, and sweet tea.   Drink at least 1 glass of water with each meal and aim for at least 8 glasses per day  Exercise at least 150 minutes every week.

## 2021-08-16 NOTE — Assessment & Plan Note (Signed)
Symptoms have been progressive and are now significantly impacting her quality of life.  We will be starting gabapentin as above and referring her to see neurosurgery.

## 2021-08-16 NOTE — Progress Notes (Signed)
   [name] is a 61 y.o. female who presents today for an office visit.  Assessment/Plan:  Chronic Problems Addressed Today: Lumbar canal stenosis Patient's MRI last year showed lumbar canal stenosis in addition to several areas of nerve impingement.  She is very reluctant to take any medications or try injections at this point however symptoms have progressed to the point where she is more open to trying this now.  We will start gabapentin 300 mg nightly and refer her to see a neurosurgeon.  Lumbar radiculopathy Symptoms have been progressive and are now significantly impacting her quality of life.  We will be starting gabapentin as above and referring her to see neurosurgery.  Elevated blood pressure reading Elevated in setting of acute pain though she has typically been well controlled.  We will continue home monitoring and she will let me know if persistently elevated.  Hopefully should have some improvement as we treat her pain.     Subjective:  HPI:  Patient here for follow up.  We last saw her about 3 months ago for lumbar radiculopathy.  We started her on gabapentin 100 mg daily however she did not feel like this was making much of a difference.  She ended up seeing orthopedics a couple of months ago who referred her for physical therapy.  She did not get any benefit from this.  She is very reluctant to take any medications and would like to avoid injections if possible.  Symptoms have been significantly worsening the last few weeks.  She has a burning sensation in both of her legs.  Also has some stiffness and pain in her back.  Symptoms are exacerbated when standing for long periods of time however resolve soon after sitting down.       Objective:  Physical Exam: BP (!) 180/110   Pulse 83   Temp 97.6 F (36.4 C) (Temporal)   Resp 17   Ht '5\' 8"'$  (1.727 m)   Wt 265 lb (120.2 kg)   SpO2 93%   BMI 40.29 kg/m   Gen: No acute distress, resting comfortably Neuro:  Grossly normal, moves all extremities Psych: Normal affect and thought content      Casilda Pickerill M. [name], MD 08/16/2021 8:30 AM

## 2021-08-20 ENCOUNTER — Telehealth: Payer: Self-pay | Admitting: Family Medicine

## 2021-08-20 ENCOUNTER — Ambulatory Visit: Payer: 59 | Admitting: Family Medicine

## 2021-08-20 NOTE — Telephone Encounter (Signed)
Form Placed on PCP office

## 2021-08-20 NOTE — Telephone Encounter (Signed)
Pt states she was told by her employer this was originally sent on 08/16/21. She states this needs to be sent back asap   .Type of form received: FMLA  Additional comments:   Received by: Adonis Brook  Form should be Faxed to: [phone]  Form should be mailed to:    Is patient requesting call for pickup:   Form placed:  In provider's box  Attach charge sheet. yes  Individual made aware of 3-5 business day turn around (Y/N)? Darreld Mclean

## 2021-08-23 NOTE — Telephone Encounter (Signed)
Pt called wanting to know if the FMLA paperwork was completed and if it needed to be faxed or picked up and transported by the pt. Please call pt back at [phone].

## 2021-08-23 NOTE — Telephone Encounter (Signed)
[employer] FMLA form faxed to [phone] copy mail to patient  If any question please give [place] a call

## 2021-08-24 NOTE — Telephone Encounter (Signed)
Patient called back to inform us that she was told the FMLA was considered incomplete. She would like to know if this paperwork could be re-completed and re-faxed.

## 2021-08-24 NOTE — Telephone Encounter (Signed)
Spoke with patient. Stated agent will send form to our office with more information of dates

## 2021-08-27 NOTE — Telephone Encounter (Signed)
Patient called back - stated agent will not be sending new forms to office - they want office to fix what we already have and send back -   Start date : 08/20/21  End date:   11/16/21

## 2021-08-27 NOTE — Telephone Encounter (Signed)
FMLA fixed and refaxed to [phone]

## 2021-08-27 NOTE — Telephone Encounter (Signed)
error 

## 2021-09-07 LAB — HM MAMMOGRAPHY

## 2021-09-13 ENCOUNTER — Telehealth: Payer: Self-pay | Admitting: Family Medicine

## 2021-09-13 NOTE — Telephone Encounter (Signed)
FMLA forms received by patient -   Patient dropped off forms and have been placed in Dr Ellwood Handler folder.  Mentioned she will be seeing Spine and neuro MD on 7/3 for a shot and on 7/24 for a 3 week follow up- stated that also needs to be included in form.   Green sheet attached

## 2021-09-13 NOTE — Telephone Encounter (Signed)
Pt states [employer] resent the paperwork via fax within the last 15 minutes.  Fax was not in queue at time of call.   Will check [employer] fax queue again before 2:00pm.

## 2021-09-13 NOTE — Telephone Encounter (Signed)
States [employer] should have faxed over short term disability forms to be completed.  Would like to know if this was received?    Please follow up in regard.

## 2021-09-14 NOTE — Telephone Encounter (Signed)
Form received

## 2021-09-17 DIAGNOSIS — M8580 Other specified disorders of bone density and structure, unspecified site: Secondary | ICD-10-CM | POA: Insufficient documentation

## 2021-09-17 NOTE — Telephone Encounter (Signed)
Form given to PCP

## 2021-09-17 NOTE — Telephone Encounter (Signed)
FYI-- Patient has called to  ensure forms have been received. I was able to tell patient these forms have been received and will be completed between 3-5 business days.

## 2021-09-19 NOTE — Telephone Encounter (Signed)
Patient requests to be called at ph# [phone] once FMLA paperwork has been completed and sent to [facility]. Patient also stated that she is seeing a Neuro Surgeon on 7/24 for a 3 week follow up which needs to be included on the [facility] paperwork.

## 2021-09-21 NOTE — Telephone Encounter (Signed)
Called patient notified FMLA faxed to [phone] Mail copy to patient

## 2021-11-15 ENCOUNTER — Encounter: Payer: Self-pay | Admitting: Family Medicine

## 2021-11-15 ENCOUNTER — Ambulatory Visit: Payer: BC Managed Care – PPO | Admitting: Family Medicine

## 2021-11-15 ENCOUNTER — Telehealth: Payer: Self-pay | Admitting: *Deleted

## 2021-11-15 VITALS — BP 128/68 | HR 85 | Temp 96.5°F | Ht 68.0 in | Wt 263.0 lb

## 2021-11-15 DIAGNOSIS — M5416 Radiculopathy, lumbar region: Secondary | ICD-10-CM | POA: Diagnosis not present

## 2021-11-15 MED ORDER — AZITHROMYCIN 250 MG PO TABS: ORAL_TABLET | ORAL | 0 refills | Status: DC

## 2021-11-15 MED ORDER — BENZONATATE 200 MG PO CAPS: 200.0000 mg | ORAL_CAPSULE | Freq: Two times a day (BID) | ORAL | 0 refills | Status: DC | PRN

## 2021-11-15 NOTE — Progress Notes (Signed)
   [name] is a 61 y.o. female who presents today for an office visit.  Assessment/Plan:  New/Acute Problems: Cough Has other URI symptoms including rhinorrhea and postnasal drip.  She does have some wheezing on exam though does not appear in respiratory distress.  Given length of symptoms will start azithromycin.  Also start Tessalon.  She can over-the-counter meds.  Encouraged hydration.  She will let us know if not improving.  Chronic Problems Addressed Today: Lumbar radiculopathy Pain is still not controlled and her symptoms including numbness and paresthesias are significantly impacting her quality of life.  She will continue taking gabapentin and continue to follow-up with neurosurgery.  She has had a trial of epidural steroid injection with minimal improvement.  She is not yet able to return to work due to the severity of her symptoms.  Her short-term disability paperwork was completed today.     Subjective:  HPI:  See a/p for status of chronic conditions.  She was last seen here about 3 months ago.  Her last visit she is having significant issues with lumbar radiculopathy.  We referred her to orthopedics.  She has had tried a few injections with out significant improvement.  She has been using gabapentin with some modest improvement as well.  She has not yet been able to go back to work due to her degree of pain and its impact on her ability perform ADLs and perform her job duties.  Over the last week or so she has also had progressively worsening cough and congestion.  No known sick contacts.  COVID test was negative.  No fevers or chills.  She has had some wheezing.  Cough has been productive of sputum.  She has had some shortness of breath with exertion        Objective:  Physical Exam: BP 128/68   Pulse 85   Temp (!) 96.5 F (35.8 C)   Ht '5\' 8"'$  (1.727 m)   Wt 263 lb (119.3 kg)   SpO2 90%   BMI 39.99 kg/m   Gen: No acute distress, resting  comfortably HEENT: TMs clear.  OP erythematous CV: Regular rate and rhythm with no murmurs appreciated Pulm: Normal work of breathing, c occasional cough with wheezing noted Neuro: Grossly normal, moves all extremities Psych: Normal affect and thought content  Time Spent: 45 minutes of total time was spent on the date of the encounter performing the following actions: chart review prior to seeing the patient including recent visits with neurosurgery, obtaining history, completing her short-term disability paperwork, performing a medically necessary exam, counseling on the treatment plan, placing orders, and documenting in our EHR.        Algis Greenhouse. [name], MD 11/15/2021 10:02 AM

## 2021-11-15 NOTE — Telephone Encounter (Signed)
Accomodation form faxed to [phone] mail to patient

## 2021-11-15 NOTE — Patient Instructions (Signed)
It was very nice to see you today!  We will fill out your paperwork today.  I will send in a cough medication and an antibiotic.  Let me know if your cough is not improving by next week.  Take care, Dr [name]  PLEASE NOTE:  If you had any lab tests please let us know if you have not heard back within a few days. You may see your results on mychart before we have a chance to review them but we will give you a call once they are reviewed by [place]. If we ordered any referrals today, please let us know if you have not heard from their office within the next week.   Please try these tips to maintain a healthy lifestyle:  Eat at least 3 REAL meals and 1-2 snacks per day.  Aim for no more than 5 hours between eating.  If you eat breakfast, please do so within one hour of getting up.   Each meal should contain half fruits/vegetables, one quarter protein, and one quarter carbs (no bigger than a computer mouse)  Cut down on sweet beverages. This includes juice, soda, and sweet tea.   Drink at least 1 glass of water with each meal and aim for at least 8 glasses per day  Exercise at least 150 minutes every week.

## 2021-11-15 NOTE — Assessment & Plan Note (Signed)
Pain is still not controlled and her symptoms including numbness and paresthesias are significantly impacting her quality of life.  She will continue taking gabapentin and continue to follow-up with neurosurgery.  She has had a trial of epidural steroid injection with minimal improvement.  She is not yet able to return to work due to the severity of her symptoms.  Her short-term disability paperwork was completed today.

## 2021-12-10 ENCOUNTER — Encounter: Payer: Self-pay | Admitting: *Deleted

## 2021-12-10 ENCOUNTER — Telehealth: Payer: Self-pay | Admitting: Family Medicine

## 2021-12-10 NOTE — Telephone Encounter (Signed)
..  Type of form received: physician statement for disability   Additional comments:   Received by: patient drop off   Form should be Faxed to: [phone]  Form should be mailed to:  na   Is patient requesting call for pickup:  yes    Form placed:   dr Ellwood Handler folder   Attach charge sheet.  Yes   Individual made aware of 3-5 business day turn around (Y/N)?  Yes

## 2021-12-11 NOTE — Telephone Encounter (Signed)
Patient states [employer] will close Patient's long term disability case if they do not receive the form Patient brought into office on  12/10/21 by tomorrow 12/12/21 and that [facility] told Patient they faxed the same form to office.

## 2021-12-11 NOTE — Telephone Encounter (Signed)
Form faxed to [phone] Copy mail to patient  Patient notified

## 2022-02-01 ENCOUNTER — Telehealth: Payer: Self-pay | Admitting: Family Medicine

## 2022-02-01 NOTE — Telephone Encounter (Signed)
.  Type of form received: Disability  Additional comments:   Received by: Adonis Brook  Form should be Faxed to: [phone]  Form should be mailed to:    Is patient requesting call for pickup:   Form placed:  In provider's box   Attach charge sheet. yes  Individual made aware of 3-5 business day turn around (Y/N)?

## 2022-02-14 DIAGNOSIS — Z0279 Encounter for issue of other medical certificate: Secondary | ICD-10-CM

## 2022-02-28 ENCOUNTER — Encounter: Payer: Self-pay | Admitting: *Deleted

## 2022-04-09 ENCOUNTER — Ambulatory Visit (INDEPENDENT_AMBULATORY_CARE_PROVIDER_SITE_OTHER): Payer: BC Managed Care – PPO | Admitting: Family Medicine

## 2022-04-09 ENCOUNTER — Encounter: Payer: Self-pay | Admitting: Family Medicine

## 2022-04-09 VITALS — BP 133/84 | HR 74 | Temp 98.2°F | Ht 68.0 in | Wt 262.6 lb

## 2022-04-09 DIAGNOSIS — Z0001 Encounter for general adult medical examination with abnormal findings: Secondary | ICD-10-CM | POA: Diagnosis not present

## 2022-04-09 DIAGNOSIS — M48061 Spinal stenosis, lumbar region without neurogenic claudication: Secondary | ICD-10-CM | POA: Diagnosis not present

## 2022-04-09 DIAGNOSIS — Z114 Encounter for screening for human immunodeficiency virus [HIV]: Secondary | ICD-10-CM

## 2022-04-09 DIAGNOSIS — Z13220 Encounter for screening for lipoid disorders: Secondary | ICD-10-CM | POA: Diagnosis not present

## 2022-04-09 DIAGNOSIS — Z1159 Encounter for screening for other viral diseases: Secondary | ICD-10-CM

## 2022-04-09 DIAGNOSIS — E119 Type 2 diabetes mellitus without complications: Secondary | ICD-10-CM | POA: Insufficient documentation

## 2022-04-09 DIAGNOSIS — Z23 Encounter for immunization: Secondary | ICD-10-CM

## 2022-04-09 DIAGNOSIS — R739 Hyperglycemia, unspecified: Secondary | ICD-10-CM | POA: Insufficient documentation

## 2022-04-09 DIAGNOSIS — M5416 Radiculopathy, lumbar region: Secondary | ICD-10-CM | POA: Diagnosis not present

## 2022-04-09 DIAGNOSIS — I10 Essential (primary) hypertension: Secondary | ICD-10-CM

## 2022-04-09 LAB — CBC
HCT: 47.6 % — ABNORMAL HIGH (ref 36.0–46.0)
Hemoglobin: 15.7 g/dL — ABNORMAL HIGH (ref 12.0–15.0)
MCHC: 33.1 g/dL (ref 30.0–36.0)
MCV: 92.5 fl (ref 78.0–100.0)
Platelets: 152 10*3/uL (ref 150.0–400.0)
RBC: 5.14 Mil/uL — ABNORMAL HIGH (ref 3.87–5.11)
RDW: 13.4 % (ref 11.5–15.5)
WBC: 11.3 10*3/uL — ABNORMAL HIGH (ref 4.0–10.5)

## 2022-04-09 LAB — LIPID PANEL
Cholesterol: 247 mg/dL — ABNORMAL HIGH (ref 0–200)
HDL: 56.3 mg/dL (ref >39.00)
NonHDL: 190.61
Total CHOL/HDL Ratio: 4
Triglycerides: 343 mg/dL — ABNORMAL HIGH (ref 0.0–149.0)
VLDL: 68.6 mg/dL — ABNORMAL HIGH (ref 0.0–40.0)

## 2022-04-09 LAB — COMPREHENSIVE METABOLIC PANEL
ALT: 13 U/L (ref 0–35)
AST: 13 U/L (ref 0–37)
Albumin: 4.1 g/dL (ref 3.5–5.2)
Alkaline Phosphatase: 90 U/L (ref 39–117)
BUN: 15 mg/dL (ref 6–23)
CO2: 28 mEq/L (ref 19–32)
Calcium: 9.2 mg/dL (ref 8.4–10.5)
Chloride: 102 mEq/L (ref 96–112)
Creatinine, Ser: 0.71 mg/dL (ref 0.40–1.20)
GFR: 93.29 mL/min (ref >60.00)
Glucose, Bld: 88 mg/dL (ref 70–99)
Potassium: 4.5 mEq/L (ref 3.5–5.1)
Sodium: 139 mEq/L (ref 135–145)
Total Bilirubin: 0.3 mg/dL (ref 0.2–1.2)
Total Protein: 6.7 g/dL (ref 6.0–8.3)

## 2022-04-09 LAB — HEMOGLOBIN A1C: Hgb A1c MFr Bld: 6.9 % — ABNORMAL HIGH (ref 4.6–6.5)

## 2022-04-09 LAB — LDL CHOLESTEROL, DIRECT: Direct LDL: 150 mg/dL

## 2022-04-09 NOTE — Assessment & Plan Note (Signed)
Still has significant amount of pain.  She is following with pain management.  Currently on Norco and gabapentin for this.  She is also following with rheumatology and is undergoing rheumatologic workup.  She will try to get [place] these records though looks like her RF factor was positive.  She has also followed with neurosurgery however has not seen them in the last few months as they told her there was nothing else they can offer her.  Did have an MRI couple years ago which showed foraminal stenosis

## 2022-04-09 NOTE — Assessment & Plan Note (Signed)
Following with pain management, neurosurgery, and rheumatology as above.  Currently on gabapentin and Norco.

## 2022-04-09 NOTE — Addendum Note (Signed)
Addended by: Betti Cruz on: 04/09/2022 09:50 AM   Modules accepted: Orders

## 2022-04-09 NOTE — Assessment & Plan Note (Signed)
Last A1c 6.5.  Recheck A1c today.

## 2022-04-09 NOTE — Patient Instructions (Signed)
It was very nice to see you today!  We will check blood work today.  Please let me know if you change your mind about colon cancer screening.  We will summarize your medical issues in your note today for your disability application.  Please let us know if you need any forms to be filled out.  We will give you your tetanus and shingles vaccine today.  I will see you back in year for your next physical.  Come back sooner if needed.    Take care, Dr [name]  PLEASE NOTE:  If you had any lab tests, please let us know if you have not heard back within a few days. You may see your results on mychart before we have a chance to review them but we will give you a call once they are reviewed by [place].   If we ordered any referrals today, please let us know if you have not heard from their office within the next week.   If you had any urgent prescriptions sent in today, please check with the pharmacy within an hour of our visit to make sure the prescription was transmitted appropriately.   Please try these tips to maintain a healthy lifestyle:  Eat at least 3 REAL meals and 1-2 snacks per day.  Aim for no more than 5 hours between eating.  If you eat breakfast, please do so within one hour of getting up.   Each meal should contain half fruits/vegetables, one quarter protein, and one quarter carbs (no bigger than a computer mouse)  Cut down on sweet beverages. This includes juice, soda, and sweet tea.   Drink at least 1 glass of water with each meal and aim for at least 8 glasses per day  Exercise at least 150 minutes every week.    Preventive Care 51-7 Years Old, Female Preventive care refers to lifestyle choices and visits with your health care provider that can promote health and wellness. Preventive care visits are also called wellness exams. What can I expect for my preventive care visit? Counseling Your health care provider may ask you questions about your: Medical history,  including: Past medical problems. Family medical history. Pregnancy history. Current health, including: Menstrual cycle. Method of birth control. Emotional well-being. Home life and relationship well-being. Sexual activity and sexual health. Lifestyle, including: Alcohol, nicotine or tobacco, and drug use. Access to firearms. Diet, exercise, and sleep habits. Work and work Statistician. Sunscreen use. Safety issues such as seatbelt and bike helmet use. Physical exam Your health care provider will check your: Height and weight. These may be used to calculate your BMI (body mass index). BMI is a measurement that tells if you are at a healthy weight. Waist circumference. This measures the distance around your waistline. This measurement also tells if you are at a healthy weight and may help predict your risk of certain diseases, such as type 2 diabetes and high blood pressure. Heart rate and blood pressure. Body temperature. Skin for abnormal spots. What immunizations do I need?  Vaccines are usually given at various ages, according to a schedule. Your health care provider will recommend vaccines for you based on your age, medical history, and lifestyle or other factors, such as travel or where you work. What tests do I need? Screening Your health care provider may recommend screening tests for certain conditions. This may include: Lipid and cholesterol levels. Diabetes screening. This is done by checking your blood sugar (glucose) after you have not eaten  for a while (fasting). Pelvic exam and Pap test. Hepatitis B test. Hepatitis C test. HIV (human immunodeficiency virus) test. STI (sexually transmitted infection) testing, if you are at risk. Lung cancer screening. Colorectal cancer screening. Mammogram. Talk with your health care provider about when you should start having regular mammograms. This may depend on whether you have a family history of breast cancer. BRCA-related  cancer screening. This may be done if you have a family history of breast, ovarian, tubal, or peritoneal cancers. Bone density scan. This is done to screen for osteoporosis. Talk with your health care provider about your test results, treatment options, and if necessary, the need for more tests. Follow these instructions at home: Eating and drinking  Eat a diet that includes fresh fruits and vegetables, whole grains, lean protein, and low-fat dairy products. Take vitamin and mineral supplements as recommended by your health care provider. Do not drink alcohol if: Your health care provider tells you not to drink. You are pregnant, may be pregnant, or are planning to become pregnant. If you drink alcohol: Limit how much you have to 0-1 drink a day. Know how much alcohol is in your drink. In the [place] one drink equals one 12 oz bottle of beer (355 mL), one 5 oz glass of wine (148 mL), or one 1 oz glass of hard liquor (44 mL). Lifestyle Brush your teeth every morning and night with fluoride toothpaste. Floss one time each day. Exercise for at least 30 minutes 5 or more days each week. Do not use any products that contain nicotine or tobacco. These products include cigarettes, chewing tobacco, and vaping devices, such as e-cigarettes. If you need help quitting, ask your health care provider. Do not use drugs. If you are sexually active, practice safe sex. Use a condom or other form of protection to prevent STIs. If you do not wish to become pregnant, use a form of birth control. If you plan to become pregnant, see your health care provider for a prepregnancy visit. Take aspirin only as told by your health care provider. Make sure that you understand how much to take and what form to take. Work with your health care provider to find out whether it is safe and beneficial for you to take aspirin daily. Find healthy ways to manage stress, such as: Meditation, yoga, or listening to  music. Journaling. Talking to a trusted person. Spending time with friends and family. Minimize exposure to UV radiation to reduce your risk of skin cancer. Safety Always wear your seat belt while driving or riding in a vehicle. Do not drive: If you have been drinking alcohol. Do not ride with someone who has been drinking. When you are tired or distracted. While texting. If you have been using any mind-altering substances or drugs. Wear a helmet and other protective equipment during sports activities. If you have firearms in your house, make sure you follow all gun safety procedures. Seek help if you have been physically or sexually abused. What's next? Visit your health care provider once a year for an annual wellness visit. Ask your health care provider how often you should have your eyes and teeth checked. Stay up to date on all vaccines. This information is not intended to replace advice given to you by your health care provider. Make sure you discuss any questions you have with your health care provider. Document Revised: 08/30/2020 Document Reviewed: 08/30/2020 Elsevier Patient Education  [place].

## 2022-04-09 NOTE — Progress Notes (Signed)
Chief Complaint:  [name] is a 62 y.o. female who presents today for her annual comprehensive physical exam.    Assessment/Plan:  Chronic Problems Addressed Today: Lumbar radiculopathy Still has significant amount of pain.  She is following with pain management.  Currently on Norco and gabapentin for this.  She is also following with rheumatology and is undergoing rheumatologic workup.  She will try to get [place] these records though looks like her RF factor was positive.  She has also followed with neurosurgery however has not seen them in the last few months as they told her there was nothing else they can offer her.  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  Currently on gabapentin and Norco.  Hyperglycemia Last A1c 6.5.  Recheck A1c today.  Essential hypertension At goal today on lisinopril 20 mg daily.   Preventative Healthcare: She declined colon cancer screening despite discussion of benefits. Will check labs today. She will get her mamogram soon. Tdap and shringrix given today.   Patient Counseling(The following topics were reviewed and/or handout was given):  -Nutrition: Stressed importance of moderation in sodium/caffeine intake, saturated fat and cholesterol, caloric balance, sufficient intake of fresh fruits, vegetables, and fiber.  -Stressed the importance of regular exercise.   -Substance Abuse: Discussed cessation/primary prevention of tobacco, alcohol, or other drug use; driving or other dangerous activities under the influence; availability of treatment for abuse.   -Injury prevention: Discussed safety belts, safety helmets, smoke detector, smoking near bedding or upholstery.   -Sexuality: Discussed sexually transmitted diseases, partner selection, use of condoms, avoidance of unintended pregnancy and contraceptive alternatives.   -Dental  health: Discussed importance of regular tooth brushing, flossing, and dental visits.  -Health maintenance and immunizations reviewed. Please refer to Health maintenance section.  Return to care in 1 year for next preventative visit.     Subjective:  HPI:  She has no acute complaints today. Patient was last seen here about 5 months ago for lumbar radiculopathy.  Patient was not fully controlled at that time.  She has had several epidural steroid injections without much improvement.  Since our last visit she has been following with neurosurgery, rheumatology, and the pain clinic. She is currently going work up for potential rheumatologic causes for her low back and radiculopathy.  Patient is about the same.  Medications are helping however she does not not like taking them.  She was started on norco and gabapentin for her pain.      04/09/2022    8:34 AM  Depression screen PHQ 2/9  Decreased Interest 0  Down, Depressed, Hopeless 0  PHQ - 2 Score 0    Health Maintenance Due  Topic Date Due   DTaP/Tdap/Td (1 - Tdap) Never done   Zoster Vaccines- Shingrix (1 of 2) Never done   MAMMOGRAM  Never done   COVID-19 Vaccine (3 - Pfizer risk series) 02/13/2020     ROS: Per HPI, otherwise a complete review of systems was negative.   PMH:  The following were reviewed and entered/updated in epic: Past Medical History:  Diagnosis Date   Bladder cancer [facility])    Patient Active Problem List   Diagnosis Date Noted   Hyperglycemia 04/09/2022   Lumbar canal stenosis 08/16/2021   Lumbar radiculopathy 05/16/2020   Bilateral hip pain 08/20/2019   Meralgia paresthetica of right side 04/28/2018   Pleural effusion 04/02/2018  Essential hypertension 04/02/2018   Skin lesion 04/02/2018   History of bladder cancer 04/02/2018   Past Surgical History:  Procedure Laterality Date   ABDOMINAL HYSTERECTOMY     BLADDER SURGERY     bladder cancer    Family History  Problem Relation Age of Onset    Stroke Mother    Diabetes Father     Medications- reviewed and updated Current Outpatient Medications  Medication Sig Dispense Refill   gabapentin (NEURONTIN) 300 MG capsule Take 1 capsule (300 mg total) by mouth at bedtime. 90 capsule 3   HYDROcodone-acetaminophen (NORCO/VICODIN) 5-325 MG tablet Oral for 30 Days     lisinopril (ZESTRIL) 20 MG tablet Oral for 30 Days     predniSONE (DELTASONE) 5 MG tablet take 4 tabs daily for 3 days, then 3 tabs daily for 3 days, then 2 tabs daily for 3 days, then 1 tab daily for 3 days Orally Once a day in the morning with food for 12     traMADol (ULTRAM) 50 MG tablet Take 50 mg by mouth 2 (two) times daily.     Vitamin D, Ergocalciferol, (DRISDOL) 1.25 MG (50000 UNIT) CAPS capsule 1 capsule Orally once a week for 90 days     No current facility-administered medications for this visit.    Allergies-reviewed and updated No Known Allergies  Social History   Socioeconomic History   Marital status: Married    Spouse name: Not on file   Number of children: Not on file   Years of education: Not on file   Highest education level: Not on file  Occupational History   Not on file  Tobacco Use   Smoking status: Former    Types: Cigarettes    Quit date: 03/18/2017    Years since quitting: 5.0   Smokeless tobacco: Never  Vaping Use   Vaping Use: Never used  Substance and Sexual Activity   Alcohol use: Never   Drug use: Never   Sexual activity: Not on file  Other Topics Concern   Not on file  Social History Narrative   Not on file   Social Determinants of Health   Financial Resource Strain: Not on file  Food Insecurity: Not on file  Transportation Needs: Not on file  Physical Activity: Not on file  Stress: Not on file  Social Connections: Not on file        Objective:  Physical Exam: BP 133/84   Pulse 74   Temp 98.2 F (36.8 C) (Temporal)   Ht '5\' 8"'$  (1.727 m)   Wt 262 lb 9.6 oz (119.1 kg)   SpO2 94%   BMI 39.93 kg/m   Body mass  index is 39.93 kg/m. Wt Readings from Last 3 Encounters:  04/09/22 262 lb 9.6 oz (119.1 kg)  11/15/21 263 lb (119.3 kg)  08/16/21 265 lb (120.2 kg)   Gen: NAD, resting comfortably HEENT: TMs normal bilaterally. OP clear. No thyromegaly noted.  CV: RRR with no murmurs appreciated Pulm: NWOB, CTAB with no crackles, wheezes, or rhonchi GI: Normal bowel sounds present. Soft, Nontender, Nondistended. MSK: no edema, cyanosis, or clubbing noted Skin: warm, dry Neuro: CN2-12 grossly intact. Strength 5/5 in upper and lower extremities. Reflexes symmetric and intact bilaterally.  Psych: Normal affect and thought content     Yovany Clock M. [name], MD 04/09/2022 9:19 AM

## 2022-04-09 NOTE — Assessment & Plan Note (Signed)
At goal today on lisinopril 20 mg daily. 

## 2022-04-10 LAB — HIV ANTIBODY (ROUTINE TESTING W REFLEX): HIV 1&2 Ab, 4th Generation: NONREACTIVE

## 2022-04-10 LAB — HEPATITIS C ANTIBODY: Hepatitis C Ab: NONREACTIVE

## 2022-04-11 ENCOUNTER — Telehealth: Payer: Self-pay | Admitting: Family Medicine

## 2022-04-11 NOTE — Telephone Encounter (Signed)
..  Type of form received:   Additional comments: accomodation request   Received HT:[id number]  Form should be Faxed to:[phone]  Form should be mailed to:  NA  Is patient requesting call for pickup: YES   Form placed:   Dr Ellwood Handler folder  Attach charge sheet.  YES Individual made aware of 3-5 business day turn around (Y/N)?  YES

## 2022-04-12 ENCOUNTER — Other Ambulatory Visit: Payer: Self-pay | Admitting: *Deleted

## 2022-04-12 ENCOUNTER — Encounter: Payer: Self-pay | Admitting: Family Medicine

## 2022-04-12 DIAGNOSIS — E785 Hyperlipidemia, unspecified: Secondary | ICD-10-CM | POA: Insufficient documentation

## 2022-04-12 DIAGNOSIS — D72829 Elevated white blood cell count, unspecified: Secondary | ICD-10-CM

## 2022-04-12 NOTE — Progress Notes (Signed)
Please inform patient of the following:  White blood cell counts are up a bit.  This is probably due to recent inflammation.  Recommend she come back in 1 to 2 weeks to recheck.  Please place future order for CBC with differential.  Her A1c is elevated into the diabetic range.  Recommend starting medications to improve her blood sugar and lower risk of heart attack and stroke. Recommend she schedule an office visit to discuss treatment options.  Her cholesterol is elevated.  Do not need to start meds for this but she should continue to work on diet and exercise.  We can recheck this in year.  Everything else is stable and we can recheck here.

## 2022-04-17 NOTE — Telephone Encounter (Signed)
FMLA placed at PCP office for review

## 2022-04-19 NOTE — Telephone Encounter (Signed)
..  Type of form received:FMLA (duplicate ?)  Additional comments:   Received by: [employer]  Form should be Faxed to: [phone]  Form should be mailed to:  na   Is patient requesting call for pickup: Na   Form placed:   Dr Ellwood Handler fodler  Attach charge sheet.  Yes  Individual made aware of 3-5 business day turn around (Y/N)?  Na

## 2022-04-22 ENCOUNTER — Ambulatory Visit: Payer: BC Managed Care – PPO | Admitting: Family Medicine

## 2022-04-22 ENCOUNTER — Encounter: Payer: Self-pay | Admitting: Family Medicine

## 2022-04-22 ENCOUNTER — Telehealth: Payer: Self-pay | Admitting: Family Medicine

## 2022-04-22 VITALS — BP 117/74 | HR 83 | Temp 97.8°F | Ht 68.0 in | Wt 264.2 lb

## 2022-04-22 DIAGNOSIS — D72829 Elevated white blood cell count, unspecified: Secondary | ICD-10-CM | POA: Diagnosis not present

## 2022-04-22 DIAGNOSIS — I10 Essential (primary) hypertension: Secondary | ICD-10-CM

## 2022-04-22 DIAGNOSIS — E1169 Type 2 diabetes mellitus with other specified complication: Secondary | ICD-10-CM | POA: Diagnosis not present

## 2022-04-22 DIAGNOSIS — Z0279 Encounter for issue of other medical certificate: Secondary | ICD-10-CM

## 2022-04-22 LAB — CBC WITH DIFFERENTIAL/PLATELET
Basophils Absolute: 0 10*3/uL (ref 0.0–0.1)
Basophils Relative: 0.4 % (ref 0.0–3.0)
Eosinophils Absolute: 0.2 10*3/uL (ref 0.0–0.7)
Eosinophils Relative: 2.2 % (ref 0.0–5.0)
HCT: 45.5 % (ref 36.0–46.0)
Hemoglobin: 15.5 g/dL — ABNORMAL HIGH (ref 12.0–15.0)
Lymphocytes Relative: 23.4 % (ref 12.0–46.0)
Lymphs Abs: 2 10*3/uL (ref 0.7–4.0)
MCHC: 34 g/dL (ref 30.0–36.0)
MCV: 91.9 fl (ref 78.0–100.0)
Monocytes Absolute: 0.5 10*3/uL (ref 0.1–1.0)
Monocytes Relative: 5.6 % (ref 3.0–12.0)
Neutro Abs: 6 10*3/uL (ref 1.4–7.7)
Neutrophils Relative %: 68.4 % (ref 43.0–77.0)
Platelets: 150 10*3/uL (ref 150.0–400.0)
RBC: 4.95 Mil/uL (ref 3.87–5.11)
RDW: 13.3 % (ref 11.5–15.5)
WBC: 8.8 10*3/uL (ref 4.0–10.5)

## 2022-04-22 MED ORDER — MELOXICAM 15 MG PO TABS: 15.0000 mg | ORAL_TABLET | Freq: Every day | ORAL | 0 refills | Status: DC

## 2022-04-22 MED ORDER — TIRZEPATIDE 2.5 MG/0.5ML ~~LOC~~ SOAJ: 2.5000 mg | SUBCUTANEOUS | 0 refills | Status: DC

## 2022-04-22 MED ORDER — TIRZEPATIDE 5 MG/0.5ML ~~LOC~~ SOAJ: 5.0000 mg | SUBCUTANEOUS | 0 refills | Status: DC

## 2022-04-22 NOTE — Assessment & Plan Note (Signed)
Blood pressure at goal on lisinopril 20 mg daily.

## 2022-04-22 NOTE — Assessment & Plan Note (Signed)
A1c 6.9.  We discussed treatment options.  She is agreeable to start medications.  Will start Mounjaro 2.5 mg weekly and increase to 5 mg weekly.  We discussed potential side effects.  She will come back to see me in 3 months we can recheck A1c at that time.

## 2022-04-22 NOTE — Progress Notes (Signed)
   [name] is a 62 y.o. female who presents today for an office visit.  Assessment/Plan:  New/Acute Problems: Leukocytosis  Recheck CBC and peripheral smear.  Right Shoulder Pain Consistent with rotator cuff tendinopathy.  We discussed home exercises and handout was given.  Also start meloxicam.  Consider referral to PT or sports medicine if not improving.  Chronic Problems Addressed Today: T2DM (type 2 diabetes mellitus) ([place]) A1c 6.9.  We discussed treatment options.  She is agreeable to start medications.  Will start Mounjaro 2.5 mg weekly and increase to 5 mg weekly.  We discussed potential side effects.  She will come back to see me in 3 months we can recheck A1c at that time.  Essential hypertension Blood pressure at goal on lisinopril 20 mg daily.    Subjective:  HPI:  See A/P for status of chronic conditions.  Patient was seen here about 2 weeks ago for annual physical.  Got labs at that time which showed A1c of 6.9 and elevated WBC.  She has also been having some right sided shoulder pain for about a week or so. Worse with certain motions. No obvious injuries or precipitating events.  No specific treatments tried.  She is also request we help her with paper work today.  She needs paperwork completed for her to get dental work done.       Objective:  Physical Exam: BP 117/74   Pulse 83   Temp 97.8 F (36.6 C) (Temporal)   Ht '5\' 8"'$  (1.727 m)   Wt 264 lb 3.2 oz (119.8 kg)   SpO2 95%   BMI 40.17 kg/m   Gen: No acute distress, resting comfortably CV: Regular rate and rhythm with no murmurs appreciated Pulm: Normal work of breathing, clear to auscultation bilaterally with no crackles, wheezes, or rhonchi MSK: - Right Shoulder: No deformities.  Pain elicited with resisted supraspinatus.  Normal internal and external rotation.  Neurovascular intact distally. Neuro: Grossly normal, moves all extremities Psych: Normal affect and thought content      [name]  M. [name], MD 04/22/2022 7:53 AM

## 2022-04-22 NOTE — Telephone Encounter (Signed)
Placed in PCP office for review

## 2022-04-22 NOTE — Telephone Encounter (Signed)
..  Type of form received: Physicians statement  Additional comments:   Received by: Sunnie Nielsen should be Faxed to:  Form should be mailed to:    Is patient requesting call for pickup:    Form placed:  In provider's box  Attach charge sheet. Yes   Individual made aware of 3-5 business day turn around (Y/N)?  yes

## 2022-04-22 NOTE — Patient Instructions (Signed)
It was very nice to see you today!  You sugar is in the diabetic range.  Please start the [facility].  Take 2.5 mg weekly for 4 weeks and then increase to 5 mg weekly.  Please come back to see me in 3 months so that we can recheck your A1c.  I believe you have a strain in your rotator cuff.  Please work on the exercises and try the meloxicam.  Let me know if not improving in the next 1 to 2 weeks.  We will check blood work today and complete your paperwork.  Take care, Dr [name]  PLEASE NOTE:  If you had any lab tests, please let us know if you have not heard back within a few days. You may see your results on mychart before we have a chance to review them but we will give you a call once they are reviewed by [place].   If we ordered any referrals today, please let us know if you have not heard from their office within the next week.   If you had any urgent prescriptions sent in today, please check with the pharmacy within an hour of our visit to make sure the prescription was transmitted appropriately.   Please try these tips to maintain a healthy lifestyle:  Eat at least 3 REAL meals and 1-2 snacks per day.  Aim for no more than 5 hours between eating.  If you eat breakfast, please do so within one hour of getting up.   Each meal should contain half fruits/vegetables, one quarter protein, and one quarter carbs (no bigger than a computer mouse)  Cut down on sweet beverages. This includes juice, soda, and sweet tea.   Drink at least 1 glass of water with each meal and aim for at least 8 glasses per day  Exercise at least 150 minutes every week.

## 2022-04-23 LAB — PATHOLOGIST SMEAR REVIEW

## 2022-04-23 NOTE — Telephone Encounter (Signed)
Patient states: - On accomodation request form from [employer] she needs to have dates for leave of absence   Patient requests: - Dates for leave of absence be from 04/19/22 until whenever Dr. [name] sees fit   She is dropping form off on 02/07 around 8am.

## 2022-04-24 NOTE — Telephone Encounter (Signed)
FMLA re faxed with correct date

## 2022-04-25 NOTE — Progress Notes (Signed)
Please inform patient of the following:  Her repeat blood work is stable.  She had a few reactive changes on her peripheral smear but nothing significant.  We do not need to make any changes to her treatment plan at this time.  We can recheck her blood work next time she comes in for an office visit.

## 2022-05-19 ENCOUNTER — Other Ambulatory Visit: Payer: Self-pay | Admitting: Family Medicine

## 2022-05-21 ENCOUNTER — Other Ambulatory Visit: Payer: Self-pay | Admitting: Family Medicine

## 2022-06-11 ENCOUNTER — Ambulatory Visit (INDEPENDENT_AMBULATORY_CARE_PROVIDER_SITE_OTHER): Payer: BC Managed Care – PPO

## 2022-06-11 ENCOUNTER — Encounter: Payer: Self-pay | Admitting: Family Medicine

## 2022-06-11 DIAGNOSIS — Z23 Encounter for immunization: Secondary | ICD-10-CM | POA: Diagnosis not present

## 2022-06-11 NOTE — Progress Notes (Signed)
Pt received 2nd Shingles in left deltoid, pt tolerated well.

## 2022-06-25 ENCOUNTER — Other Ambulatory Visit: Payer: Self-pay | Admitting: Family Medicine

## 2022-06-26 ENCOUNTER — Other Ambulatory Visit: Payer: Self-pay | Admitting: Family Medicine

## 2022-07-12 ENCOUNTER — Telehealth: Payer: Self-pay | Admitting: Family Medicine

## 2022-07-12 NOTE — Telephone Encounter (Signed)
Patient dropped off document  Disability Forms , to be filled out by provider. Patient requested to send it via Fax within 5-days. Document is located in providers tray at front office.

## 2022-07-16 NOTE — Telephone Encounter (Signed)
Placed in PCP sign folder.

## 2022-07-22 NOTE — Telephone Encounter (Signed)
Form was faxed to [phone] on 07/19/2022 Copy placed to be scan in patient chart

## 2022-08-26 ENCOUNTER — Telehealth: Payer: Self-pay | Admitting: *Deleted

## 2022-08-26 NOTE — Telephone Encounter (Signed)
(  [name] - [phone] Mounjaro 5MG /0.5ML pen-injectors Status: PA RequestCreated: June 6th, 2024 [phone]: June 10th, 2024 Waiting for determination

## 2022-08-28 ENCOUNTER — Telehealth: Payer: Self-pay

## 2022-08-28 NOTE — Telephone Encounter (Signed)
Received fax for additional info and documentation supporting a history of an A1C greater than or equal to 6.5%. Completed fax and faxed back to insurance ([phone]) along with lab results.

## 2022-08-29 NOTE — Telephone Encounter (Signed)
Rx Mounjaro approved form 08/29/2022 till 08/28/2025

## 2022-10-30 ENCOUNTER — Telehealth: Payer: Self-pay | Admitting: Family Medicine

## 2022-10-30 NOTE — Telephone Encounter (Signed)
Patient dropped off document disability ppw , to be filled out by provider. Patient requested to send it back via Fax [phone]  . Document is located in providers tray at front office.Please advise.

## 2022-11-04 NOTE — Telephone Encounter (Signed)
Placed form in PCP office to be reviewed

## 2022-11-07 DIAGNOSIS — Z0279 Encounter for issue of other medical certificate: Secondary | ICD-10-CM

## 2022-11-15 ENCOUNTER — Telehealth: Payer: Self-pay | Admitting: Family Medicine

## 2022-11-15 NOTE — Telephone Encounter (Signed)
..  Patient dropped off document  Attending Physician statement , to be filled out by provider. Patient requested to send it back via Fax within 5-days. Document is located in providers tray at front office.Please advise at Mobile [phone] (mobile)

## 2022-11-20 NOTE — Telephone Encounter (Signed)
Patient notified rheumatology need to sign form  Patietn stated will pick up form tomorrow

## 2022-11-20 NOTE — Telephone Encounter (Signed)
Form placed at front office to be pick up

## 2022-11-26 ENCOUNTER — Encounter: Payer: Self-pay | Admitting: Family Medicine

## 2022-11-26 ENCOUNTER — Ambulatory Visit: Payer: BC Managed Care – PPO | Admitting: Family Medicine

## 2022-11-26 VITALS — BP 124/79 | HR 72 | Temp 97.5°F | Ht 68.0 in | Wt 254.8 lb

## 2022-11-26 DIAGNOSIS — M5416 Radiculopathy, lumbar region: Secondary | ICD-10-CM | POA: Diagnosis not present

## 2022-11-26 DIAGNOSIS — I10 Essential (primary) hypertension: Secondary | ICD-10-CM | POA: Diagnosis not present

## 2022-11-26 DIAGNOSIS — M069 Rheumatoid arthritis, unspecified: Secondary | ICD-10-CM | POA: Diagnosis not present

## 2022-11-26 DIAGNOSIS — Z7985 Long-term (current) use of injectable non-insulin antidiabetic drugs: Secondary | ICD-10-CM | POA: Diagnosis not present

## 2022-11-26 DIAGNOSIS — E1169 Type 2 diabetes mellitus with other specified complication: Secondary | ICD-10-CM | POA: Diagnosis not present

## 2022-11-26 DIAGNOSIS — Z23 Encounter for immunization: Secondary | ICD-10-CM | POA: Diagnosis not present

## 2022-11-26 LAB — MICROALBUMIN / CREATININE URINE RATIO
Creatinine,U: 106.5 mg/dL
Microalb Creat Ratio: 0.7 mg/g (ref 0.0–30.0)
Microalb, Ur: 0.7 mg/dL (ref 0.0–1.9)

## 2022-11-26 LAB — POCT GLYCOSYLATED HEMOGLOBIN (HGB A1C): Hemoglobin A1C: 5.8 % — AB (ref 4.0–5.6)

## 2022-11-26 NOTE — Progress Notes (Signed)
   [name] is a 62 y.o. female who presents today for an office visit.  Assessment/Plan:  Chronic Problems Addressed Today: T2DM (type 2 diabetes mellitus) (HCC) A1c stable 5.8.  Will continue Mounjaro 5 mg weekly.  She will come back in 6 months for CPE and we will recheck A1c at that time.  Essential hypertension Blood pressure is at goal on lisinopril 20 mg daily.  Lumbar radiculopathy Still having significant mount of pain.  Following with pain management and neurosurgery for this.  She is also recently established with rheumatology and was diagnosed with psoriatic redness.  Unclear if this is contributing to her lumbar radiculopathy symptoms as well however it is possible.  She is currently on chronic Norco and gabapentin.  This is managed by pain management.  She does not wish to increase dose of narcotics at this point.  Will complete disability paperwork today.  Rheumatoid arthritis [name] Following with rheumatology.  Recently diagnosed.  She is on Plaquenil 400 mg daily and recently started on Hyrimoz 40 mg weekly.  She is not sure if this made much of a difference as of yet though will follow up them rheum soon.      Subjective:  HPI:  See Assessment / plan for status of chronic conditions.  Patient is here today for follow-up.  We last saw her about 6 or 7 months ago.  At that time A1c was 6.9 and we started [facility].  Since her last visit she is still having ongoing issues with lumbar radiculopathy.  She is currently following with neurosurgery and is on short-term disability.  She does have paperwork for [place] to complete today.  She discussed this with her neurosurgeon who said they do not do the paperwork.  She has not had much change in symptoms over the last several months.  She is now on chronic narcotics which are modestly helpful however she does not wish to escalate the dose due to concern for side effects.  Since our last visit she was diagnosed with  psoriatic arthritis with rheumatology. She is currently on hyroxychloroquine and hyrimoz. She has not noticed much change in symptoms but will follow up with them soon.        Objective:  Physical Exam: BP 124/79   Pulse 72   Temp (!) 97.5 F (36.4 C) (Temporal)   Ht 5\' 8"  (1.727 m)   Wt 254 lb 12.8 oz (115.6 kg)   SpO2 97%   BMI 38.74 kg/m   Wt Readings from Last 3 Encounters:  11/26/22 254 lb 12.8 oz (115.6 kg)  04/22/22 264 lb 3.2 oz (119.8 kg)  04/09/22 262 lb 9.6 oz (119.1 kg)    Gen: No acute distress, resting comfortably CV: Regular rate and rhythm with no murmurs appreciated Pulm: Normal work of breathing, clear to auscultation bilaterally with no crackles, wheezes, or rhonchi Neuro: Grossly normal, moves all extremities Psych: Normal affect and thought content      [name] M. Jimmey Ralph, MD 11/26/2022 10:24 AM

## 2022-11-26 NOTE — Assessment & Plan Note (Signed)
Following with rheumatology.  Recently diagnosed.  She is on Plaquenil 400 mg daily and recently started on Hyrimoz 40 mg weekly.  She is not sure if this made much of a difference as of yet though will follow up them rheum soon.

## 2022-11-26 NOTE — Assessment & Plan Note (Signed)
A1c stable 5.8.  Will continue Mounjaro 5 mg weekly.  She will come back in 6 months for CPE and we will recheck A1c at that time.

## 2022-11-26 NOTE — Assessment & Plan Note (Signed)
Blood pressure is at goal on lisinopril 20 mg daily.

## 2022-11-26 NOTE — Patient Instructions (Addendum)
It was very nice to see you today!  We will complete your paperwork today.  Your A1c looks great.  Please continue your current dose of Mounjaro.  I will see you back in 6 months for your annual physical.  Come back sooner if needed.  Return in about 6 months (around 05/26/2023) for Annual Physical.   Take care, Dr Jimmey Ralph  PLEASE NOTE:  If you had any lab tests, please let us know if you have not heard back within a few days. You may see your results on mychart before we have a chance to review them but we will give you a call once they are reviewed by [place].   If we ordered any referrals today, please let us know if you have not heard from their office within the next week.   If you had any urgent prescriptions sent in today, please check with the pharmacy within an hour of our visit to make sure the prescription was transmitted appropriately.   Please try these tips to maintain a healthy lifestyle:  Eat at least 3 REAL meals and 1-2 snacks per day.  Aim for no more than 5 hours between eating.  If you eat breakfast, please do so within one hour of getting up.   Each meal should contain half fruits/vegetables, one quarter protein, and one quarter carbs (no bigger than a computer mouse)  Cut down on sweet beverages. This includes juice, soda, and sweet tea.   Drink at least 1 glass of water with each meal and aim for at least 8 glasses per day  Exercise at least 150 minutes every week.

## 2022-11-26 NOTE — Assessment & Plan Note (Signed)
Still having significant mount of pain.  Following with pain management and neurosurgery for this.  She is also recently established with rheumatology and was diagnosed with psoriatic redness.  Unclear if this is contributing to her lumbar radiculopathy symptoms as well however it is possible.  She is currently on chronic Norco and gabapentin.  This is managed by pain management.  She does not wish to increase dose of narcotics at this point.  Will complete disability paperwork today.

## 2022-11-28 NOTE — Progress Notes (Signed)
Great news! Urine test is normal. We can recheck in a year.  [name]. Jimmey Ralph, MD 11/28/2022 12:25 PM

## 2023-01-16 ENCOUNTER — Telehealth: Payer: Self-pay | Admitting: Family Medicine

## 2023-01-16 NOTE — Telephone Encounter (Signed)
Prescription Request  01/16/2023  LOV: 11/26/2022  What is the name of the medication or equipment?  tirzepatide [facility]) 5 MG/0.5ML Pen   Have you contacted your pharmacy to request a refill? Yes   Which pharmacy would you like this sent to?  [id number] [place] [address] - [address] [place] [address] Phone: [phone] Fax: [phone]    Patient notified that their request is being sent to the clinical staff for review and that they should receive a response within 2 business days.   Please advise at Mobile [phone] (mobile)

## 2023-01-17 ENCOUNTER — Other Ambulatory Visit: Payer: Self-pay | Admitting: *Deleted

## 2023-01-17 MED ORDER — TIRZEPATIDE 5 MG/0.5ML ~~LOC~~ SOAJ: 5.0000 mg | SUBCUTANEOUS | 0 refills | Status: DC

## 2023-01-17 NOTE — Telephone Encounter (Signed)
Rx send to pharmacy  

## 2023-03-03 DIAGNOSIS — M5416 Radiculopathy, lumbar region: Secondary | ICD-10-CM | POA: Diagnosis not present

## 2023-03-03 DIAGNOSIS — M79605 Pain in left leg: Secondary | ICD-10-CM | POA: Diagnosis not present

## 2023-03-03 DIAGNOSIS — M4716 Other spondylosis with myelopathy, lumbar region: Secondary | ICD-10-CM | POA: Diagnosis not present

## 2023-03-03 DIAGNOSIS — Z79899 Other long term (current) drug therapy: Secondary | ICD-10-CM | POA: Diagnosis not present

## 2023-03-03 DIAGNOSIS — M79604 Pain in right leg: Secondary | ICD-10-CM | POA: Diagnosis not present

## 2023-03-06 DIAGNOSIS — Z79899 Other long term (current) drug therapy: Secondary | ICD-10-CM | POA: Diagnosis not present

## 2023-04-02 DIAGNOSIS — M4716 Other spondylosis with myelopathy, lumbar region: Secondary | ICD-10-CM | POA: Diagnosis not present

## 2023-04-02 DIAGNOSIS — M069 Rheumatoid arthritis, unspecified: Secondary | ICD-10-CM | POA: Diagnosis not present

## 2023-04-02 DIAGNOSIS — Z79899 Other long term (current) drug therapy: Secondary | ICD-10-CM | POA: Diagnosis not present

## 2023-04-03 ENCOUNTER — Encounter: Payer: Self-pay | Admitting: Family Medicine

## 2023-04-03 DIAGNOSIS — Z1231 Encounter for screening mammogram for malignant neoplasm of breast: Secondary | ICD-10-CM

## 2023-04-08 ENCOUNTER — Other Ambulatory Visit: Payer: Self-pay | Admitting: Family Medicine

## 2023-04-08 DIAGNOSIS — Z79899 Other long term (current) drug therapy: Secondary | ICD-10-CM | POA: Diagnosis not present

## 2023-04-08 NOTE — Telephone Encounter (Signed)
Please start PA. Thanks.

## 2023-04-09 ENCOUNTER — Other Ambulatory Visit (HOSPITAL_COMMUNITY): Payer: Self-pay

## 2023-04-09 ENCOUNTER — Telehealth: Payer: Self-pay | Admitting: Pharmacy Technician

## 2023-04-09 NOTE — Telephone Encounter (Signed)
Pharmacy Patient Advocate Encounter   Received notification from RX Request Messages that prior authorization for Mounjaro 5MG /0.5ML auto-injectors is required/requested.   Insurance verification completed.   The patient is insured through [facility] .   Per test claim: PA required; PA submitted to above mentioned insurance via CoverMyMeds Key/confirmation #/EOC [id number] Status is pending

## 2023-04-09 NOTE — Telephone Encounter (Signed)
PA request has been Submitted. New Encounter created for follow up. For additional info see Pharmacy Prior Auth telephone encounter from 04/09/23.

## 2023-05-02 DIAGNOSIS — Z79899 Other long term (current) drug therapy: Secondary | ICD-10-CM | POA: Diagnosis not present

## 2023-05-02 DIAGNOSIS — H6122 Impacted cerumen, left ear: Secondary | ICD-10-CM | POA: Diagnosis not present

## 2023-05-02 DIAGNOSIS — M4716 Other spondylosis with myelopathy, lumbar region: Secondary | ICD-10-CM | POA: Diagnosis not present

## 2023-05-02 DIAGNOSIS — M069 Rheumatoid arthritis, unspecified: Secondary | ICD-10-CM | POA: Diagnosis not present

## 2023-05-09 ENCOUNTER — Other Ambulatory Visit: Payer: Self-pay | Admitting: *Deleted

## 2023-05-09 ENCOUNTER — Telehealth: Payer: Self-pay | Admitting: Family Medicine

## 2023-05-09 DIAGNOSIS — M069 Rheumatoid arthritis, unspecified: Secondary | ICD-10-CM

## 2023-05-09 NOTE — Telephone Encounter (Signed)
 Referral placed.

## 2023-05-09 NOTE — Telephone Encounter (Unsigned)
Copied from CRM [id number]. Topic: Referral - Request for Referral >> May 09, 2023 11:07 AM Corin V wrote: Did the patient discuss referral with their provider in the last year? Yes (If No - schedule appointment) (If Yes - send message)  Appointment offered? No  Type of order/referral and detailed reason for visit: ongoing issues/standing diagnosis  Preference of office, provider, location: [facility]  If referral order, have you been seen by this specialty before? Yes- in [place], no longer accepting insurance [facility]) (If Yes, this issue or another issue? When? Where?  Can we respond through MyChart? Yes

## 2023-05-26 ENCOUNTER — Encounter: Payer: Self-pay | Admitting: Family Medicine

## 2023-05-26 ENCOUNTER — Ambulatory Visit (INDEPENDENT_AMBULATORY_CARE_PROVIDER_SITE_OTHER): Payer: 59 | Admitting: Family Medicine

## 2023-05-26 VITALS — BP 138/85 | Temp 97.5°F | Ht 68.0 in | Wt 259.2 lb

## 2023-05-26 DIAGNOSIS — I10 Essential (primary) hypertension: Secondary | ICD-10-CM | POA: Diagnosis not present

## 2023-05-26 DIAGNOSIS — Z0001 Encounter for general adult medical examination with abnormal findings: Secondary | ICD-10-CM

## 2023-05-26 DIAGNOSIS — M069 Rheumatoid arthritis, unspecified: Secondary | ICD-10-CM | POA: Diagnosis not present

## 2023-05-26 DIAGNOSIS — E1169 Type 2 diabetes mellitus with other specified complication: Secondary | ICD-10-CM

## 2023-05-26 DIAGNOSIS — E785 Hyperlipidemia, unspecified: Secondary | ICD-10-CM | POA: Diagnosis not present

## 2023-05-26 DIAGNOSIS — Z7985 Long-term (current) use of injectable non-insulin antidiabetic drugs: Secondary | ICD-10-CM

## 2023-05-26 LAB — CBC
HCT: 48.3 % — ABNORMAL HIGH (ref 36.0–46.0)
Hemoglobin: 16.1 g/dL — ABNORMAL HIGH (ref 12.0–15.0)
MCHC: 33.3 g/dL (ref 30.0–36.0)
MCV: 93.3 fl (ref 78.0–100.0)
Platelets: 150 10*3/uL (ref 150.0–400.0)
RBC: 5.18 Mil/uL — ABNORMAL HIGH (ref 3.87–5.11)
RDW: 13.1 % (ref 11.5–15.5)
WBC: 8.2 10*3/uL (ref 4.0–10.5)

## 2023-05-26 LAB — COMPREHENSIVE METABOLIC PANEL
ALT: 17 U/L (ref 0–35)
AST: 18 U/L (ref 0–37)
Albumin: 4.2 g/dL (ref 3.5–5.2)
Alkaline Phosphatase: 81 U/L (ref 39–117)
BUN: 14 mg/dL (ref 6–23)
CO2: 28 meq/L (ref 19–32)
Calcium: 9.2 mg/dL (ref 8.4–10.5)
Chloride: 102 meq/L (ref 96–112)
Creatinine, Ser: 0.8 mg/dL (ref 0.40–1.20)
GFR: 80.21 mL/min (ref >60.00)
Glucose, Bld: 97 mg/dL (ref 70–99)
Potassium: 4.5 meq/L (ref 3.5–5.1)
Sodium: 138 meq/L (ref 135–145)
Total Bilirubin: 0.4 mg/dL (ref 0.2–1.2)
Total Protein: 6.9 g/dL (ref 6.0–8.3)

## 2023-05-26 LAB — HEMOGLOBIN A1C: Hgb A1c MFr Bld: 6.2 % (ref 4.6–6.5)

## 2023-05-26 LAB — LIPID PANEL
Cholesterol: 230 mg/dL — ABNORMAL HIGH (ref 0–200)
HDL: 47 mg/dL (ref >39.00)
LDL Cholesterol: 112 mg/dL — ABNORMAL HIGH (ref 0–99)
NonHDL: 182.92
Total CHOL/HDL Ratio: 5
Triglycerides: 354 mg/dL — ABNORMAL HIGH (ref 0.0–149.0)
VLDL: 70.8 mg/dL — ABNORMAL HIGH (ref 0.0–40.0)

## 2023-05-26 LAB — TSH: TSH: 1.4 u[IU]/mL (ref 0.35–5.50)

## 2023-05-26 NOTE — Progress Notes (Signed)
 Chief Complaint:  [name] is a 63 y.o. female who presents today for her annual comprehensive physical exam.    Assessment/Plan:  Chronic Problems Addressed Today: Essential hypertension Blood pressure at goal today on lisinopril 20 mg daily.  Check labs.  Rheumatoid arthritis (HCC) I seeing rheumatology for this.  She is on Plaquenil 400 mg daily and Hymiroz 40 mg weekly.  Her previous dermatologist does not accept her insurance and she will be establishing with a new rheumatologist in a few months.  Dyslipidemia Check lipids.  Discussed lifestyle modifications.  T2DM (type 2 diabetes mellitus) (HCC) Check A1c. She is doing well with Mounjaro 5 mg weekly.  Preventative Healthcare: Check labs. Gets mammogram and pap through GYN - will obtain records.  Prevnar 20 given today.  Patient Counseling(The following topics were reviewed and/or handout was given):  -Nutrition: Stressed importance of moderation in sodium/caffeine intake, saturated fat and cholesterol, caloric balance, sufficient intake of fresh fruits, vegetables, and fiber.  -Stressed the importance of regular exercise.   -Substance Abuse: Discussed cessation/primary prevention of tobacco, alcohol, or other drug use; driving or other dangerous activities under the influence; availability of treatment for abuse.   -Injury prevention: Discussed safety belts, safety helmets, smoke detector, smoking near bedding or upholstery.   -Sexuality: Discussed sexually transmitted diseases, partner selection, use of condoms, avoidance of unintended pregnancy and contraceptive alternatives.   -Dental health: Discussed importance of regular tooth brushing, flossing, and dental visits.  -Health maintenance and immunizations reviewed. Please refer to Health maintenance section.  Return to care in 1 year for next preventative visit.     Subjective:  HPI:  She has no acute complaints today. See Assessment / plan for status of  chronic conditions. Since our last visit she has been following with rheumatology and has been on hyrimoz.   Lifestyle Diet: None specific.  Exercise: Limited due to her above orthopedic issues.      11/26/2022    9:43 AM  Depression screen PHQ 2/9  Decreased Interest 2  Down, Depressed, Hopeless 3  PHQ - 2 Score 5  Altered sleeping 2  Tired, decreased energy 3  Change in appetite 3  Feeling bad or failure about yourself  3  Trouble concentrating 2  Moving slowly or fidgety/restless 1  Suicidal thoughts 0  PHQ-9 Score 19  Difficult doing work/chores Very difficult    Health Maintenance Due  Topic Date Due   Pneumococcal Vaccine 59-31 Years old (1 of 2 - PCV) Never done   FOOT EXAM  Never done   OPHTHALMOLOGY EXAM  Never done   Cervical Cancer Screening (HPV/Pap Cotest)  Never done   MAMMOGRAM  Never done   HEMOGLOBIN A1C  05/26/2023     ROS: Per HPI, otherwise a complete review of systems was negative.   PMH:  The following were reviewed and entered/updated in epic: Past Medical History:  Diagnosis Date   Bladder cancer [facility])    Patient Active Problem List   Diagnosis Date Noted   Rheumatoid arthritis (HCC) 11/26/2022   Dyslipidemia 04/12/2022   T2DM (type 2 diabetes mellitus) (HCC) 04/09/2022   Lumbar canal stenosis 08/16/2021   Lumbar radiculopathy 05/16/2020   Bilateral hip pain 08/20/2019   Meralgia paresthetica of right side 04/28/2018   Pleural effusion 04/02/2018   Essential hypertension 04/02/2018   Skin lesion 04/02/2018   History of bladder cancer 04/02/2018   Past Surgical History:  Procedure Laterality Date   ABDOMINAL HYSTERECTOMY  BLADDER SURGERY     bladder cancer    Family History  Problem Relation Age of Onset   Stroke Mother    Diabetes Father     Medications- reviewed and updated Current Outpatient Medications  Medication Sig Dispense Refill   Adalimumab-adaz (HYRIMOZ) 40 MG/0.4ML SOSY Inject 40 mg into the skin once a  week.     gabapentin (NEURONTIN) 300 MG capsule Take 1 capsule (300 mg total) by mouth at bedtime. 90 capsule 3   HYDROcodone-acetaminophen (NORCO/VICODIN) 5-325 MG tablet Oral for 30 Days     hydroxychloroquine (PLAQUENIL) 200 MG tablet Take 400 mg by mouth daily.     lisinopril (ZESTRIL) 20 MG tablet Oral for 30 Days     meloxicam (MOBIC) 15 MG tablet TAKE 1 TABLET (15 MG TOTAL) BY MOUTH DAILY. 30 tablet 0   terbinafine (LAMISIL) 250 MG tablet Take 250 mg by mouth daily.     tirzepatide (MOUNJARO) 5 MG/0.5ML Pen INJECT 5 MG SUBCUTANEOUSLY WEEKLY 6 mL 0   Vitamin D, Ergocalciferol, (DRISDOL) 1.25 MG (50000 UNIT) CAPS capsule 1 capsule Orally once a week for 90 days     No current facility-administered medications for this visit.    Allergies-reviewed and updated No Known Allergies  Social History   Socioeconomic History   Marital status: Married    Spouse name: Not on file   Number of children: Not on file   Years of education: Not on file   Highest education level: Not on file  Occupational History   Not on file  Tobacco Use   Smoking status: Former    Current packs/day: 0.00    Types: Cigarettes    Quit date: 03/18/2017    Years since quitting: 6.1   Smokeless tobacco: Never  Vaping Use   Vaping status: Never Used  Substance and Sexual Activity   Alcohol use: Never   Drug use: Never   Sexual activity: Not on file  Other Topics Concern   Not on file  Social History Narrative   Not on file   Social Drivers of Health   Financial Resource Strain: Not on file  Food Insecurity: Not on file  Transportation Needs: Not on file  Physical Activity: Not on file  Stress: Not on file  Social Connections: Not on file        Objective:  Physical Exam: BP 138/85   Temp (!) 97.5 F (36.4 C)   Ht 5\' 8"  (1.727 m)   Wt 259 lb 3.2 oz (117.6 kg)   SpO2 95%   BMI 39.41 kg/m   Body mass index is 39.41 kg/m. Wt Readings from Last 3 Encounters:  05/26/23 259 lb 3.2 oz (117.6  kg)  11/26/22 254 lb 12.8 oz (115.6 kg)  04/22/22 264 lb 3.2 oz (119.8 kg)  Gen: NAD, resting comfortably HEENT: TMs normal bilaterally. OP clear. No thyromegaly noted.  CV: RRR with no murmurs appreciated Pulm: NWOB, CTAB with no crackles, wheezes, or rhonchi GI: Normal bowel sounds present. Soft, Nontender, Nondistended. MSK: no edema, cyanosis, or clubbing noted Skin: warm, dry Neuro: CN2-12 grossly intact. Strength 5/5 in upper and lower extremities. Reflexes symmetric and intact bilaterally.  Psych: Normal affect and thought content     Dredyn Gubbels M. Jimmey Ralph, MD 05/26/2023 9:02 AM

## 2023-05-26 NOTE — Patient Instructions (Signed)
 It was very nice to see you today!  We will check blood work today.  Will get your pneumonia vaccine today.  Will fill out your handicap placard.  Return in about 1 year (around 05/25/2024) for Annual Physical.   Take care, Dr Jimmey Ralph  PLEASE NOTE:  If you had any lab tests, please let us know if you have not heard back within a few days. You may see your results on mychart before we have a chance to review them but we will give you a call once they are reviewed by [place].   If we ordered any referrals today, please let us know if you have not heard from their office within the next week.   If you had any urgent prescriptions sent in today, please check with the pharmacy within an hour of our visit to make sure the prescription was transmitted appropriately.   Please try these tips to maintain a healthy lifestyle:  Eat at least 3 REAL meals and 1-2 snacks per day.  Aim for no more than 5 hours between eating.  If you eat breakfast, please do so within one hour of getting up.   Each meal should contain half fruits/vegetables, one quarter protein, and one quarter carbs (no bigger than a computer mouse)  Cut down on sweet beverages. This includes juice, soda, and sweet tea.   Drink at least 1 glass of water with each meal and aim for at least 8 glasses per day  Exercise at least 150 minutes every week.    Preventive Care 64-58 Years Old, Female Preventive care refers to lifestyle choices and visits with your health care provider that can promote health and wellness. Preventive care visits are also called wellness exams. What can I expect for my preventive care visit? Counseling Your health care provider may ask you questions about your: Medical history, including: Past medical problems. Family medical history. Pregnancy history. Current health, including: Menstrual cycle. Method of birth control. Emotional well-being. Home life and relationship well-being. Sexual activity and  sexual health. Lifestyle, including: Alcohol, nicotine or tobacco, and drug use. Access to firearms. Diet, exercise, and sleep habits. Work and work Astronomer. Sunscreen use. Safety issues such as seatbelt and bike helmet use. Physical exam Your health care provider will check your: Height and weight. These may be used to calculate your BMI (body mass index). BMI is a measurement that tells if you are at a healthy weight. Waist circumference. This measures the distance around your waistline. This measurement also tells if you are at a healthy weight and may help predict your risk of certain diseases, such as type 2 diabetes and high blood pressure. Heart rate and blood pressure. Body temperature. Skin for abnormal spots. What immunizations do I need?  Vaccines are usually given at various ages, according to a schedule. Your health care provider will recommend vaccines for you based on your age, medical history, and lifestyle or other factors, such as travel or where you work. What tests do I need? Screening Your health care provider may recommend screening tests for certain conditions. This may include: Lipid and cholesterol levels. Diabetes screening. This is done by checking your blood sugar (glucose) after you have not eaten for a while (fasting). Pelvic exam and Pap test. Hepatitis B test. Hepatitis C test. HIV (human immunodeficiency virus) test. STI (sexually transmitted infection) testing, if you are at risk. Lung cancer screening. Colorectal cancer screening. Mammogram. Talk with your health care provider about when you should start  having regular mammograms. This may depend on whether you have a family history of breast cancer. BRCA-related cancer screening. This may be done if you have a family history of breast, ovarian, tubal, or peritoneal cancers. Bone density scan. This is done to screen for osteoporosis. Talk with your health care provider about your test results,  treatment options, and if necessary, the need for more tests. Follow these instructions at home: Eating and drinking  Eat a diet that includes fresh fruits and vegetables, whole grains, lean protein, and low-fat dairy products. Take vitamin and mineral supplements as recommended by your health care provider. Do not drink alcohol if: Your health care provider tells you not to drink. You are pregnant, may be pregnant, or are planning to become pregnant. If you drink alcohol: Limit how much you have to 0-1 drink a day. Know how much alcohol is in your drink. In the [place] one drink equals one 12 oz bottle of beer (355 mL), one 5 oz glass of wine (148 mL), or one 1 oz glass of hard liquor (44 mL). Lifestyle Brush your teeth every morning and night with fluoride toothpaste. Floss one time each day. Exercise for at least 30 minutes 5 or more days each week. Do not use any products that contain nicotine or tobacco. These products include cigarettes, chewing tobacco, and vaping devices, such as e-cigarettes. If you need help quitting, ask your health care provider. Do not use drugs. If you are sexually active, practice safe sex. Use a condom or other form of protection to prevent STIs. If you do not wish to become pregnant, use a form of birth control. If you plan to become pregnant, see your health care provider for a prepregnancy visit. Take aspirin only as told by your health care provider. Make sure that you understand how much to take and what form to take. Work with your health care provider to find out whether it is safe and beneficial for you to take aspirin daily. Find healthy ways to manage stress, such as: Meditation, yoga, or listening to music. Journaling. Talking to a trusted person. Spending time with friends and family. Minimize exposure to UV radiation to reduce your risk of skin cancer. Safety Always wear your seat belt while driving or riding in a vehicle. Do not drive: If you  have been drinking alcohol. Do not ride with someone who has been drinking. When you are tired or distracted. While texting. If you have been using any mind-altering substances or drugs. Wear a helmet and other protective equipment during sports activities. If you have firearms in your house, make sure you follow all gun safety procedures. Seek help if you have been physically or sexually abused. What's next? Visit your health care provider once a year for an annual wellness visit. Ask your health care provider how often you should have your eyes and teeth checked. Stay up to date on all vaccines. This information is not intended to replace advice given to you by your health care provider. Make sure you discuss any questions you have with your health care provider. Document Revised: 08/30/2020 Document Reviewed: 08/30/2020 Elsevier Patient Education  2024 [employer].

## 2023-05-26 NOTE — Assessment & Plan Note (Signed)
 Check lipids. Discussed lifestyle modifications.

## 2023-05-26 NOTE — Assessment & Plan Note (Signed)
 Check A1c. She is doing well with Mounjaro 5 mg weekly.

## 2023-05-26 NOTE — Assessment & Plan Note (Signed)
 Blood pressure at goal today on lisinopril 20 mg daily.  Check labs.

## 2023-05-26 NOTE — Progress Notes (Signed)
 Her red blood cell counts are a little bit elevated.  This is probably nothing to worry about however I would like for her to come back and recheck.  Please place future order for CBC with differential.  Her blood sugar is little bit elevated but overall stable.  Do not need to start or change meds for either of these however she should continue to work on diet and exercise and we can recheck in 6 months or so.  Her cholesterol levels are elevated.  She would benefit from starting statin to improve her numbers and lower risk of heart attack and stroke.  Please send in Lipitor 40 mg daily if she is agreeable to start.  The rest of her labs are all normal we can recheck everything else in year.

## 2023-05-26 NOTE — Assessment & Plan Note (Signed)
 I seeing rheumatology for this.  She is on Plaquenil 400 mg daily and Hymiroz 40 mg weekly.  Her previous dermatologist does not accept her insurance and she will be establishing with a new rheumatologist in a few months.

## 2023-05-28 ENCOUNTER — Other Ambulatory Visit: Payer: Self-pay

## 2023-05-28 ENCOUNTER — Telehealth: Payer: Self-pay

## 2023-05-28 DIAGNOSIS — R718 Other abnormality of red blood cells: Secondary | ICD-10-CM

## 2023-05-28 NOTE — Telephone Encounter (Signed)
 Copied from CRM [id number]. Topic: Clinical - Medication Question >> May 28, 2023  1:26 PM Isabelle Course C wrote: Reason for CRM: Patient needs something to lower cholesterol

## 2023-05-30 DIAGNOSIS — Z79899 Other long term (current) drug therapy: Secondary | ICD-10-CM | POA: Diagnosis not present

## 2023-05-30 DIAGNOSIS — M4716 Other spondylosis with myelopathy, lumbar region: Secondary | ICD-10-CM | POA: Diagnosis not present

## 2023-05-30 DIAGNOSIS — M069 Rheumatoid arthritis, unspecified: Secondary | ICD-10-CM | POA: Diagnosis not present

## 2023-05-30 DIAGNOSIS — M79605 Pain in left leg: Secondary | ICD-10-CM | POA: Diagnosis not present

## 2023-05-30 DIAGNOSIS — M79604 Pain in right leg: Secondary | ICD-10-CM | POA: Diagnosis not present

## 2023-06-04 DIAGNOSIS — Z79899 Other long term (current) drug therapy: Secondary | ICD-10-CM | POA: Diagnosis not present

## 2023-06-13 ENCOUNTER — Telehealth: Payer: Self-pay | Admitting: Family Medicine

## 2023-06-13 NOTE — Telephone Encounter (Signed)
 [facility] faxed document  Attending physicians statement , to be filled out by provider. Patient requested to send it back via Fax within 5-days. Document is located in providers tray at front office.Please advise at  [phone].

## 2023-06-16 NOTE — Telephone Encounter (Signed)
 Placed in PCP office for reviewed

## 2023-06-17 DIAGNOSIS — Z0279 Encounter for issue of other medical certificate: Secondary | ICD-10-CM

## 2023-06-17 NOTE — Telephone Encounter (Signed)
 Patient dropped off another copy of this form

## 2023-06-19 NOTE — Telephone Encounter (Signed)
 Form faxed on 06/18/2023 [phone] Copy mail to patient  Form placed to be scan in patient chart

## 2023-06-20 DIAGNOSIS — M4716 Other spondylosis with myelopathy, lumbar region: Secondary | ICD-10-CM | POA: Diagnosis not present

## 2023-06-20 DIAGNOSIS — M5416 Radiculopathy, lumbar region: Secondary | ICD-10-CM | POA: Diagnosis not present

## 2023-07-01 DIAGNOSIS — Z1211 Encounter for screening for malignant neoplasm of colon: Secondary | ICD-10-CM | POA: Diagnosis not present

## 2023-07-01 DIAGNOSIS — M5416 Radiculopathy, lumbar region: Secondary | ICD-10-CM | POA: Diagnosis not present

## 2023-07-01 DIAGNOSIS — M069 Rheumatoid arthritis, unspecified: Secondary | ICD-10-CM | POA: Diagnosis not present

## 2023-07-01 DIAGNOSIS — Z79899 Other long term (current) drug therapy: Secondary | ICD-10-CM | POA: Diagnosis not present

## 2023-07-01 DIAGNOSIS — Z9189 Other specified personal risk factors, not elsewhere classified: Secondary | ICD-10-CM | POA: Diagnosis not present

## 2023-07-01 DIAGNOSIS — M4716 Other spondylosis with myelopathy, lumbar region: Secondary | ICD-10-CM | POA: Diagnosis not present

## 2023-07-01 DIAGNOSIS — Z780 Asymptomatic menopausal state: Secondary | ICD-10-CM | POA: Diagnosis not present

## 2023-07-01 DIAGNOSIS — Z1231 Encounter for screening mammogram for malignant neoplasm of breast: Secondary | ICD-10-CM | POA: Diagnosis not present

## 2023-07-04 ENCOUNTER — Other Ambulatory Visit (HOSPITAL_COMMUNITY): Payer: Self-pay

## 2023-07-04 NOTE — Telephone Encounter (Signed)
 Pharmacy Patient Advocate Encounter  Received notification from [employer] that Prior Authorization for  Mounjaro  5MG /0.5ML auto-injectors has been APPROVED from 04-09-2023 to 04-08-2024   PA #/Case ID/Reference #: [id number]

## 2023-07-08 DIAGNOSIS — Z780 Asymptomatic menopausal state: Secondary | ICD-10-CM | POA: Diagnosis not present

## 2023-07-21 ENCOUNTER — Other Ambulatory Visit: Payer: Self-pay | Admitting: Family Medicine

## 2023-07-31 ENCOUNTER — Ambulatory Visit (INDEPENDENT_AMBULATORY_CARE_PROVIDER_SITE_OTHER): Admitting: Family Medicine

## 2023-07-31 ENCOUNTER — Encounter: Payer: Self-pay | Admitting: Family Medicine

## 2023-07-31 VITALS — BP 166/96 | HR 91 | Temp 98.0°F | Ht 68.0 in | Wt 257.4 lb

## 2023-07-31 DIAGNOSIS — L304 Erythema intertrigo: Secondary | ICD-10-CM | POA: Diagnosis not present

## 2023-07-31 DIAGNOSIS — G5711 Meralgia paresthetica, right lower limb: Secondary | ICD-10-CM | POA: Diagnosis not present

## 2023-07-31 DIAGNOSIS — I10 Essential (primary) hypertension: Secondary | ICD-10-CM

## 2023-07-31 DIAGNOSIS — L820 Inflamed seborrheic keratosis: Secondary | ICD-10-CM | POA: Diagnosis not present

## 2023-07-31 DIAGNOSIS — Z7985 Long-term (current) use of injectable non-insulin antidiabetic drugs: Secondary | ICD-10-CM | POA: Diagnosis not present

## 2023-07-31 DIAGNOSIS — M069 Rheumatoid arthritis, unspecified: Secondary | ICD-10-CM | POA: Diagnosis not present

## 2023-07-31 DIAGNOSIS — M4716 Other spondylosis with myelopathy, lumbar region: Secondary | ICD-10-CM | POA: Diagnosis not present

## 2023-07-31 DIAGNOSIS — E1169 Type 2 diabetes mellitus with other specified complication: Secondary | ICD-10-CM | POA: Diagnosis not present

## 2023-07-31 DIAGNOSIS — M5416 Radiculopathy, lumbar region: Secondary | ICD-10-CM | POA: Diagnosis not present

## 2023-07-31 DIAGNOSIS — Z6839 Body mass index (BMI) 39.0-39.9, adult: Secondary | ICD-10-CM | POA: Diagnosis not present

## 2023-07-31 DIAGNOSIS — Z79899 Other long term (current) drug therapy: Secondary | ICD-10-CM | POA: Diagnosis not present

## 2023-07-31 DIAGNOSIS — D751 Secondary polycythemia: Secondary | ICD-10-CM | POA: Insufficient documentation

## 2023-07-31 MED ORDER — MELOXICAM 15 MG PO TABS: 15.0000 mg | ORAL_TABLET | Freq: Every day | ORAL | 0 refills | Status: DC

## 2023-07-31 MED ORDER — LISINOPRIL 20 MG PO TABS: 20.0000 mg | ORAL_TABLET | Freq: Every day | ORAL | 3 refills | Status: AC

## 2023-07-31 MED ORDER — TIRZEPATIDE 7.5 MG/0.5ML ~~LOC~~ SOAJ: 7.5000 mg | SUBCUTANEOUS | 5 refills | Status: DC

## 2023-07-31 MED ORDER — GABAPENTIN 300 MG PO CAPS: 300.0000 mg | ORAL_CAPSULE | Freq: Every day | ORAL | 3 refills | Status: AC

## 2023-07-31 MED ORDER — KETOCONAZOLE 2 % EX CREA: 1.0000 | TOPICAL_CREAM | Freq: Two times a day (BID) | CUTANEOUS | 0 refills | Status: DC

## 2023-07-31 NOTE — Progress Notes (Signed)
 [name] is a 63 y.o. female who presents today for an office visit.  Assessment/Plan:  New/Acute Problems: Inflamed seborrheic keratosis Cryotherapy applied today.  See below procedure note.  She tolerated well.  We discussed care after  Intertrigo May be related to her hymiroz that she recently discontinued this.  Most recent A1c was at goal.  Will start topical ketoconazole.  She will let us  know if not improving.  Chronic Problems Addressed Today: Essential hypertension Blood pressure elevated today though she is out of lisinopril for last 4 weeks.  Will refill today.  She will follow-up with us  in a couple of weeks.  Typically well-controlled on previous regimen.  T2DM (type 2 diabetes mellitus) (HCC) Last A1c is at goal no she is interested in increasing dose of Mounjaro .  She has been tolerating 5 mg weekly well.  Will increase to 7.5 mg weekly.  Recheck A1c in 3 months.  Lumbar radiculopathy Following with pain management and is on chronic Norco for this however she needs refill on gabapentin .  She has previously been tolerating well.  Will refill today.  Will defer Norco management to her pain management specialist.  Erythrocytosis Patient with elevated RBCs on labs here couple months ago.  She had this rechecked at the rheumatology office last month which showed persistent elevated RBCs and hemoglobin.  Will place referral to hematology for further management and evaluation.  Rheumatoid arthritis (HCC) She is no longer on Plaquenil or Hymiroz.  Pain has been stable.  She will be establishing with a new rheumatologist in a couple of months.  We will refill her meloxicam  today.     Subjective:  HPI:  See A/P for status of chronic conditions.  Patient is here today for follow-up.  I last saw her 2 months ago for her annual physical.  Her primary concern today is rash.  This has developed over the last few months.  She believes it is due to a side effect of her  hymiroz.  She discontinued this several weeks ago.  She has noticed an erythematous itchy rash at the top of her gluteal cleft and beneath her skin folds in her lower abdomen.  She has not tried anything for this symptomatically.  Symptoms seem to be worsening.  She has also noticed a dark lesion about a centimeter in size on her right lower abdomen.  This area is also very itchy.  Seems to be increasing in size over the last several weeks.  No specific treatment tried for this.  She also fortunately ran out of all of her medications several weeks ago including her lisinopril and gabapentin .  She request refills on these today.  She is still waiting to establish with a new rheumatologist and will be seeing them in a couple of months.  She has been following with pain management and they have been providing her with Norco for pain relief.        Objective:  Physical Exam: BP (!) 166/96   Pulse 91   Temp 98 F (36.7 C) (Temporal)   Ht 5\' 8"  (1.727 m)   Wt 257 lb 6.4 oz (116.8 kg)   SpO2 98%   BMI 39.14 kg/m   Gen: No acute distress, resting comfortably CV: Regular rate and rhythm with no murmurs appreciated Pulm: Normal work of breathing, clear to auscultation bilaterally with no crackles, wheezes, or rhonchi Skin: Approximately 1 cm pigmented hyperkeratotic lesion on right lower abdomen with surrounding erythema consistent with inflamed  seborrheic keratosis.  Scattered erythematous rash with maceration along skin folds in upper gluteal cleft and lower abdomen. Neuro: Grossly normal, moves all extremities Psych: Normal affect and thought content  Cryotherapy Procedure Note  Pre-operative Diagnosis: Inflamed seborrheic keratosis  Locations: Right lower abdomen  Indications: Therapeutic  Procedure Details  Patient informed of risks (permanent scarring, infection, light or dark discoloration, bleeding, infection, weakness, numbness and recurrence of the lesion) and benefits of the  procedure and verbal informed consent obtained.  The areas are treated with liquid nitrogen therapy, frozen until ice ball extended 2 mm beyond lesion, allowed to thaw, and treated again. The patient tolerated procedure well.  The patient was instructed on post-op care, warned that there may be blister formation, redness and pain. Recommend OTC analgesia as needed for pain.  Condition: Stable  Complications: none.        Jinny Mounts. Daneil Dunker, MD 07/31/2023 8:46 AM

## 2023-07-31 NOTE — Assessment & Plan Note (Signed)
 Blood pressure elevated today though she is out of lisinopril for last 4 weeks.  Will refill today.  She will follow-up with us  in a couple of weeks.  Typically well-controlled on previous regimen.

## 2023-07-31 NOTE — Patient Instructions (Signed)
 It was very nice to see you today!  The black spot on your abdomen is called seborrheic keratosis.  We froze this area today.  This should fall off within the next couple of weeks.  Let us  know if you have any issues with this.  You have a yeast infection in your skin.  This may be due to a side effect of your medication.  Please start the ketoconazole cream twice daily.  Let us  know if not improving.  I will refer you to see the hematologist to do further testing on your blood work.  We will increase your Mounjaro  to 7.5 mg weekly.  I will refill your gabapentin , meloxicam , and lisinopril.  Please monitor your blood pressure at home and let us  know if persistently elevated.  Please follow-up with us  in a couple weeks to let us  know how you are doing.  I will see you back in 3 months for your next visit.  We can recheck your A1c at that time.  Please come back sooner if needed.  Return in about 3 months (around 10/31/2023).   Take care, Dr Daneil Dunker  PLEASE NOTE:  If you had any lab tests, please let us  know if you have not heard back within a few days. You may see your results on mychart before we have a chance to review them but we will give you a call once they are reviewed by us .   If we ordered any referrals today, please let us  know if you have not heard from their office within the next week.   If you had any urgent prescriptions sent in today, please check with the pharmacy within an hour of our visit to make sure the prescription was transmitted appropriately.   Please try these tips to maintain a healthy lifestyle:  Eat at least 3 REAL meals and 1-2 snacks per day.  Aim for no more than 5 hours between eating.  If you eat breakfast, please do so within one hour of getting up.   Each meal should contain half fruits/vegetables, one quarter protein, and one quarter carbs (no bigger than a computer mouse)  Cut down on sweet beverages. This includes juice, soda, and sweet tea.    Drink at least 1 glass of water with each meal and aim for at least 8 glasses per day  Exercise at least 150 minutes every week.

## 2023-07-31 NOTE — Assessment & Plan Note (Signed)
 Patient with elevated RBCs on labs here couple months ago.  She had this rechecked at the rheumatology office last month which showed persistent elevated RBCs and hemoglobin.  Will place referral to hematology for further management and evaluation.

## 2023-07-31 NOTE — Assessment & Plan Note (Signed)
 Last A1c is at goal no she is interested in increasing dose of Mounjaro .  She has been tolerating 5 mg weekly well.  Will increase to 7.5 mg weekly.  Recheck A1c in 3 months.

## 2023-07-31 NOTE — Assessment & Plan Note (Signed)
 Following with pain management and is on chronic Norco for this however she needs refill on gabapentin .  She has previously been tolerating well.  Will refill today.  Will defer Norco management to her pain management specialist.

## 2023-07-31 NOTE — Assessment & Plan Note (Addendum)
 She is no longer on Plaquenil or Hymiroz.  Pain has been stable.  She will be establishing with a new rheumatologist in a couple of months.  We will refill her meloxicam  today.

## 2023-08-05 DIAGNOSIS — R21 Rash and other nonspecific skin eruption: Secondary | ICD-10-CM | POA: Diagnosis not present

## 2023-08-05 DIAGNOSIS — Z1211 Encounter for screening for malignant neoplasm of colon: Secondary | ICD-10-CM | POA: Diagnosis not present

## 2023-08-06 DIAGNOSIS — Z79899 Other long term (current) drug therapy: Secondary | ICD-10-CM | POA: Diagnosis not present

## 2023-08-08 DIAGNOSIS — I10 Essential (primary) hypertension: Secondary | ICD-10-CM | POA: Diagnosis not present

## 2023-08-08 DIAGNOSIS — E6609 Other obesity due to excess calories: Secondary | ICD-10-CM | POA: Diagnosis not present

## 2023-08-08 DIAGNOSIS — M069 Rheumatoid arthritis, unspecified: Secondary | ICD-10-CM | POA: Diagnosis not present

## 2023-08-08 DIAGNOSIS — Z6838 Body mass index (BMI) 38.0-38.9, adult: Secondary | ICD-10-CM | POA: Diagnosis not present

## 2023-08-08 DIAGNOSIS — G4710 Hypersomnia, unspecified: Secondary | ICD-10-CM | POA: Diagnosis not present

## 2023-08-08 DIAGNOSIS — M4716 Other spondylosis with myelopathy, lumbar region: Secondary | ICD-10-CM | POA: Diagnosis not present

## 2023-08-19 ENCOUNTER — Encounter: Payer: Self-pay | Admitting: Nurse Practitioner

## 2023-08-19 ENCOUNTER — Inpatient Hospital Stay: Attending: Nurse Practitioner | Admitting: Nurse Practitioner

## 2023-08-19 ENCOUNTER — Inpatient Hospital Stay

## 2023-08-19 VITALS — BP 138/80 | HR 94 | Temp 97.6°F | Resp 17 | Wt 250.6 lb

## 2023-08-19 DIAGNOSIS — E669 Obesity, unspecified: Secondary | ICD-10-CM | POA: Insufficient documentation

## 2023-08-19 DIAGNOSIS — Z87891 Personal history of nicotine dependence: Secondary | ICD-10-CM | POA: Insufficient documentation

## 2023-08-19 DIAGNOSIS — M1990 Unspecified osteoarthritis, unspecified site: Secondary | ICD-10-CM | POA: Insufficient documentation

## 2023-08-19 DIAGNOSIS — Z8551 Personal history of malignant neoplasm of bladder: Secondary | ICD-10-CM | POA: Insufficient documentation

## 2023-08-19 DIAGNOSIS — Z791 Long term (current) use of non-steroidal anti-inflammatories (NSAID): Secondary | ICD-10-CM | POA: Insufficient documentation

## 2023-08-19 DIAGNOSIS — I10 Essential (primary) hypertension: Secondary | ICD-10-CM | POA: Insufficient documentation

## 2023-08-19 DIAGNOSIS — Z7985 Long-term (current) use of injectable non-insulin antidiabetic drugs: Secondary | ICD-10-CM | POA: Diagnosis not present

## 2023-08-19 DIAGNOSIS — R21 Rash and other nonspecific skin eruption: Secondary | ICD-10-CM | POA: Diagnosis not present

## 2023-08-19 DIAGNOSIS — D751 Secondary polycythemia: Secondary | ICD-10-CM | POA: Diagnosis not present

## 2023-08-19 DIAGNOSIS — Z79899 Other long term (current) drug therapy: Secondary | ICD-10-CM | POA: Insufficient documentation

## 2023-08-19 DIAGNOSIS — E119 Type 2 diabetes mellitus without complications: Secondary | ICD-10-CM | POA: Diagnosis not present

## 2023-08-19 LAB — CBC WITH DIFFERENTIAL (CANCER CENTER ONLY)
Abs Immature Granulocytes: 0.02 10*3/uL (ref 0.00–0.07)
Basophils Absolute: 0 10*3/uL (ref 0.0–0.1)
Basophils Relative: 0 %
Eosinophils Absolute: 0.2 10*3/uL (ref 0.0–0.5)
Eosinophils Relative: 2 %
HCT: 47.8 % — ABNORMAL HIGH (ref 36.0–46.0)
Hemoglobin: 16.3 g/dL — ABNORMAL HIGH (ref 12.0–15.0)
Immature Granulocytes: 0 %
Lymphocytes Relative: 28 %
Lymphs Abs: 2.8 10*3/uL (ref 0.7–4.0)
MCH: 30.8 pg (ref 26.0–34.0)
MCHC: 34.1 g/dL (ref 30.0–36.0)
MCV: 90.2 fL (ref 80.0–100.0)
Monocytes Absolute: 0.7 10*3/uL (ref 0.1–1.0)
Monocytes Relative: 7 %
Neutro Abs: 6.3 10*3/uL (ref 1.7–7.7)
Neutrophils Relative %: 63 %
Platelet Count: 144 10*3/uL — ABNORMAL LOW (ref 150–400)
RBC: 5.3 MIL/uL — ABNORMAL HIGH (ref 3.87–5.11)
RDW: 12.3 % (ref 11.5–15.5)
WBC Count: 10 10*3/uL (ref 4.0–10.5)
nRBC: 0 % (ref 0.0–0.2)

## 2023-08-19 NOTE — Progress Notes (Cosign Needed)
 [facility]   [phone] [phone]   Clinic New consult Note   Patient Care Team: Rodney Clamp, MD as PCP - General (Family Medicine) 08/19/2023  CHIEF COMPLAINTS/PURPOSE OF CONSULTATION:  Erythrocytosis, referred by PCP Dr. Valdene Garret  HISTORY OF PRESENTING ILLNESS:  [name] 63 y.o. female with PMH including RA, lumbar radiculopathy, DM, HTN, snoring, obesity, and bladder cancer is here because of elevated RBC/Hgb. Hemoglobin became elevated on CBC 05/16/20 with Hgb 15.8/HCT 46.4. Hgb remained elevated in this range through 04/22/22 until it increased slightly to 16.1 on 05/26/23 and she was referred to hematology. No other cytopenias. Denies h/o thrombosis.   Socially, she is married with 1 daughter. Originally from [place] but has lived in [place]  for 20 years. Independent with ADLs. Denies alcohol use. Smoked 1 PPD x20 years and quit 5-10 years ago. Up to date on mammogram, will have first colonoscopy in 09/2023.   Today she presents by herself. She doesn't feel like herself. Main complaint is scattered non itching skin rash on her right arm, right leg, left breast, and back. She has back pain and arthritis. Wakes herself up snoring, sleep study in July. Drinks water but likely not enough. Denies recent fever/chills, cough, chest pain, dyspnea.    MEDICAL HISTORY:  Past Medical History:  Diagnosis Date   Arthritis    Bladder cancer (HCC)    Diabetes mellitus without complication (HCC)    Hypertension     SURGICAL HISTORY: Past Surgical History:  Procedure Laterality Date   ABDOMINAL HYSTERECTOMY     BLADDER SURGERY     bladder cancer    SOCIAL HISTORY: Social History   Socioeconomic History   Marital status: Married    Spouse name: Not on file   Number of children: Not on file   Years of education: Not on file   Highest education level: Not on file  Occupational History   Not on file  Tobacco Use   Smoking status: Former     Current packs/day: 0.00    Types: Cigarettes    Quit date: 03/18/2017    Years since quitting: 6.4   Smokeless tobacco: Never  Vaping Use   Vaping status: Never Used  Substance and Sexual Activity   Alcohol use: Never   Drug use: Never   Sexual activity: Not on file  Other Topics Concern   Not on file  Social History Narrative   Not on file   Social Drivers of Health   Financial Resource Strain: Not on file  Food Insecurity: No Food Insecurity (08/19/2023)   Hunger Vital Sign    Worried About Running Out of Food in the Last Year: Never true    Ran Out of Food in the Last Year: Never true  Transportation Needs: No Transportation Needs (08/19/2023)   PRAPARE - Administrator, Civil Service (Medical): No    Lack of Transportation (Non-Medical): No  Physical Activity: Not on file  Stress: Not on file  Social Connections: Not on file  Intimate Partner Violence: Not At Risk (08/19/2023)   Humiliation, Afraid, Rape, and Kick questionnaire    Fear of Current or Ex-Partner: No    Emotionally Abused: No    Physically Abused: No    Sexually Abused: No    FAMILY HISTORY: Family History  Problem Relation Age of Onset   Stroke Mother    Diabetes Father     ALLERGIES:  has no known allergies.  MEDICATIONS:  Current Outpatient Medications  Medication Sig Dispense Refill   gabapentin  (NEURONTIN ) 300 MG capsule Take 1 capsule (300 mg total) by mouth at bedtime. 90 capsule 3   HYDROcodone-acetaminophen (NORCO/VICODIN) 5-325 MG tablet Oral for 30 Days     ketoconazole  (NIZORAL ) 2 % cream Apply 1 Application topically 2 (two) times daily. 60 g 0   lisinopril  (ZESTRIL ) 20 MG tablet Take 1 tablet (20 mg total) by mouth daily. 90 tablet 3   meloxicam  (MOBIC ) 15 MG tablet Take 1 tablet (15 mg total) by mouth daily. 30 tablet 0   tirzepatide  (MOUNJARO ) 7.5 MG/0.5ML Pen Inject 7.5 mg into the skin once a week. 6 mL 5   Vitamin D, Ergocalciferol, (DRISDOL) 1.25 MG (50000 UNIT) CAPS  capsule      Adalimumab-adaz (HYRIMOZ) 40 MG/0.4ML SOSY Inject 40 mg into the skin once a week. (Patient not taking: Reported on 08/19/2023)     hydroxychloroquine (PLAQUENIL) 200 MG tablet Take 400 mg by mouth daily. (Patient not taking: Reported on 08/19/2023)     No current facility-administered medications for this visit.    REVIEW OF SYSTEMS:   Constitutional: Denies fevers, chills or abnormal night sweats Eyes: Denies blurriness of vision, double vision or watery eyes Ears, nose, mouth, throat, and face: Denies mucositis or sore throat Respiratory: Denies cough, dyspnea or wheezes (+) snoring Cardiovascular: Denies palpitation, chest discomfort or lower extremity swelling Gastrointestinal:  Denies nausea, heartburn or change in bowel habits Skin: (+) skin rashes Lymphatics: Denies new lymphadenopathy or easy bruising Neurological:Denies numbness, tingling or new weaknesses Behavioral/Psych: Mood is stable, no new changes  MSK: (+) back pain (+) RA All other systems were reviewed with the patient and are negative.  PHYSICAL EXAMINATION: ECOG PERFORMANCE STATUS: 0 - Asymptomatic  Vitals:   08/19/23 1201  BP: 138/80  Pulse: 94  Resp: 17  Temp: 97.6 F (36.4 C)  SpO2: 96%   Filed Weights   08/19/23 1201  Weight: 250 lb 9.6 oz (113.7 kg)    GENERAL:alert, no distress and comfortable SKIN: isolated eruption to R antecubital fossa and behind R knee  EYES: sclera clear LYMPH:  no palpable cervical lymphadenopathy  LUNGS: clear with normal breathing effort HEART: regular rate & rhythm,  no lower extremity edema ABDOMEN:abdomen soft, non-tender and normal bowel sounds Musculoskeletal:no cyanosis of digits and no clubbing  PSYCH: alert & oriented x 3 with fluent speech NEURO: no focal motor/sensory deficits  LABORATORY DATA:  I have reviewed the data as listed    Latest Ref Rng & Units 05/26/2023    9:17 AM 04/22/2022    8:05 AM 04/09/2022    8:59 AM  CBC  WBC 4.0 - 10.5  K/uL 8.2  8.8  11.3   Hemoglobin 12.0 - 15.0 g/dL 62.9  52.8  41.3   Hematocrit 36.0 - 46.0 % 48.3  45.5  47.6   Platelets 150.0 - 400.0 K/uL 150.0  150.0  152.0        Latest Ref Rng & Units 05/26/2023    9:17 AM 04/09/2022    8:59 AM 05/16/2020   11:01 AM  CMP  Glucose 70 - 99 mg/dL 97  88  95   BUN 6 - 23 mg/dL 14  15  11    Creatinine 0.40 - 1.20 mg/dL 2.44  0.10  2.72   Sodium 135 - 145 mEq/L 138  139  137   Potassium 3.5 - 5.1 mEq/L 4.5  4.5  4.1   Chloride 96 - 112 mEq/L  102  102  102   CO2 19 - 32 mEq/L 28  28  29    Calcium 8.4 - 10.5 mg/dL 9.2  9.2  9.2   Total Protein 6.0 - 8.3 g/dL 6.9  6.7  7.1   Total Bilirubin 0.2 - 1.2 mg/dL 0.4  0.3  0.3   Alkaline Phos 39 - 117 U/L 81  90  80   AST 0 - 37 U/L 18  13  22    ALT 0 - 35 U/L 17  13  18       RADIOGRAPHIC STUDIES: I have personally reviewed the radiological images as listed and agreed with the findings in the report. No results found.  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  -The thrombosis risk in secondary polycythemia is not increased as in PV, but should still engage in risk reducing strategies. She can donate blood 1-2 times per year.  -She is traveling to [place] soon and was encouraged to hydrate, wear compression stockings, and consider baby ASA with international travel -Will call her with results; if secondary, she does not need to see us  routinely -Pt seen with Dr. Maryalice Smaller  Skin rash  -Not improving on ketozonazole cream, f/up PCP or referral to derm  Health maintenance  -UTD on mammogram, having first colonoscopy  in 09/2023, qualifies for lung cancer screening CT -S/p bladder cancer in 2018 s/p resection and BCG at [facility] -Encouraged healthy weight loss/ active lifestyle  -Continue alcohol and smoking cessation    PLAN: -Medical record reviewed -Labs today (CBC, Epo, JAK2 panel), will call with results -If no PV, this is likely benign/reactive secondary to smoking history, obesity, and likely sleep apnea (sleep study in 09/2023), treatment includes managing the underlying cause and blood donation 1-2x per year -Encouraged to hydrate, wear compression stockings, and consider baby ASA with upcoming international travel -F/up pending today's lab results -Pt seen with Dr. Maryalice Smaller   Orders Placed This Encounter  Procedures   CBC with Differential (Cancer Center Only)    Standing Status:   Future    Number of Occurrences:   1    Expiration Date:   08/18/2024   Erythropoietin    Standing Status:   Future    Number of Occurrences:   1    Expiration Date:   08/18/2024   JAK2 V617F rfx CALR/MPL/E12-15    No problem-specific Assessment & Plan notes found for this encounter.    All questions were answered. The patient knows to call the clinic with any problems, questions or concerns.      Laurice Iglesia K Shykeria Sakamoto, NP 08/19/23    Addendum I have seen the patient, examined her. I agree with the assessment and and plan and have edited the notes.   63 year old female with past medical history of rheumatoid arthritis, diabetes, hypertension, obesity, smoking history, who was referred by PCP for elevated hemoglobin 16.1.  No previous history of thrombosis.  She is asymptomatic.  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  I recommend sleep  study, smoking cessation, and weight loss through diet and exercise.  Will call her with lab results, all questions were answered.  I spent a total of 30 minutes for her visit today.  Sonja Breckenridge  MD 08/19/2023

## 2023-08-20 LAB — ERYTHROPOIETIN: Erythropoietin: 12.6 m[IU]/mL (ref 2.6–18.5)

## 2023-08-25 ENCOUNTER — Ambulatory Visit: Payer: Self-pay | Admitting: Nurse Practitioner

## 2023-08-25 LAB — CALR +MPL + E12-E15  (REFLEX)

## 2023-08-25 LAB — JAK2 V617F RFX CALR/MPL/E12-15

## 2023-08-27 ENCOUNTER — Other Ambulatory Visit: Payer: Self-pay | Admitting: Family Medicine

## 2023-08-28 NOTE — Telephone Encounter (Addendum)
 Called patient to relay the message below as per Lacie Burton NP, their was no answer LM on the VM and instructed patient to call us  back if she had any further question. Also sent message to Mychart.    ----- Message from Lacie K Burton sent at 08/25/2023  4:39 PM EDT ----- Autry Legions, could you please let pt know EPO is normal and JAK2 panel is negative, she does not have primary bone marrow condition such as polycythemia vera causing elevated hgb/hct. This is most likely benign/reactive to h/o smoking and possible sleep apnea. I believe she has sleep study coming up. She can donate blood 1-2 times per year and f/up with PCP, she does not need to see us  regularly.   Thanks Lacie NP

## 2023-09-08 DIAGNOSIS — M5416 Radiculopathy, lumbar region: Secondary | ICD-10-CM | POA: Diagnosis not present

## 2023-09-08 DIAGNOSIS — M069 Rheumatoid arthritis, unspecified: Secondary | ICD-10-CM | POA: Diagnosis not present

## 2023-09-08 DIAGNOSIS — Z79899 Other long term (current) drug therapy: Secondary | ICD-10-CM | POA: Diagnosis not present

## 2023-09-09 ENCOUNTER — Telehealth: Payer: Self-pay

## 2023-09-09 NOTE — Telephone Encounter (Signed)
 Copied from CRM [id number]. Topic: Referral - Request for Referral >> Sep 08, 2023  9:43 AM Ernestene P wrote: Did the patient discuss referral with their provider in the last year? Yes (If No - schedule appointment) (If Yes - send message)  Appointment offered? No  Type of order/referral and detailed reason for visit: Dermatologist - Intertrigo  Preference of office, provider, location: No preference  If referral order, have you been seen by this specialty before? No (If Yes, this issue or another issue? When? Where?  Can we respond through MyChart? Yes

## 2023-09-10 NOTE — Telephone Encounter (Signed)
Ok to placed referral  

## 2023-09-11 ENCOUNTER — Other Ambulatory Visit: Payer: Self-pay | Admitting: *Deleted

## 2023-09-11 DIAGNOSIS — L304 Erythema intertrigo: Secondary | ICD-10-CM

## 2023-09-11 DIAGNOSIS — Z79899 Other long term (current) drug therapy: Secondary | ICD-10-CM | POA: Diagnosis not present

## 2023-09-11 NOTE — Telephone Encounter (Signed)
 Dermatology referral placed

## 2023-09-11 NOTE — Telephone Encounter (Signed)
 Ok with me. Please place any necessary orders.

## 2023-09-17 DIAGNOSIS — K635 Polyp of colon: Secondary | ICD-10-CM | POA: Diagnosis not present

## 2023-09-29 DIAGNOSIS — K635 Polyp of colon: Secondary | ICD-10-CM | POA: Diagnosis not present

## 2023-09-30 DIAGNOSIS — M069 Rheumatoid arthritis, unspecified: Secondary | ICD-10-CM | POA: Diagnosis not present

## 2023-09-30 DIAGNOSIS — Z79899 Other long term (current) drug therapy: Secondary | ICD-10-CM | POA: Diagnosis not present

## 2023-09-30 DIAGNOSIS — M4716 Other spondylosis with myelopathy, lumbar region: Secondary | ICD-10-CM | POA: Diagnosis not present

## 2023-10-02 DIAGNOSIS — Z79899 Other long term (current) drug therapy: Secondary | ICD-10-CM | POA: Diagnosis not present

## 2023-10-03 ENCOUNTER — Ambulatory Visit: Payer: PRIVATE HEALTH INSURANCE | Attending: Internal Medicine | Admitting: Internal Medicine

## 2023-10-03 ENCOUNTER — Encounter: Payer: Self-pay | Admitting: Internal Medicine

## 2023-10-03 VITALS — BP 111/72 | HR 74 | Ht 68.0 in | Wt 246.0 lb

## 2023-10-03 DIAGNOSIS — Z79899 Other long term (current) drug therapy: Secondary | ICD-10-CM | POA: Insufficient documentation

## 2023-10-03 DIAGNOSIS — M159 Polyosteoarthritis, unspecified: Secondary | ICD-10-CM | POA: Diagnosis present

## 2023-10-03 DIAGNOSIS — N898 Other specified noninflammatory disorders of vagina: Secondary | ICD-10-CM | POA: Insufficient documentation

## 2023-10-03 DIAGNOSIS — G5711 Meralgia paresthetica, right lower limb: Secondary | ICD-10-CM | POA: Insufficient documentation

## 2023-10-03 DIAGNOSIS — M1990 Unspecified osteoarthritis, unspecified site: Secondary | ICD-10-CM | POA: Diagnosis not present

## 2023-10-03 DIAGNOSIS — Z90710 Acquired absence of both cervix and uterus: Secondary | ICD-10-CM | POA: Insufficient documentation

## 2023-10-03 DIAGNOSIS — I83029 Varicose veins of left lower extremity with ulcer of unspecified site: Secondary | ICD-10-CM | POA: Insufficient documentation

## 2023-10-03 DIAGNOSIS — L989 Disorder of the skin and subcutaneous tissue, unspecified: Secondary | ICD-10-CM | POA: Insufficient documentation

## 2023-10-03 MED ORDER — TRIAMCINOLONE ACETONIDE 0.5 % EX OINT: 1.0000 | TOPICAL_OINTMENT | Freq: Two times a day (BID) | CUTANEOUS | 0 refills | Status: DC

## 2023-10-03 MED ORDER — CYCLOBENZAPRINE HCL 10 MG PO TABS: 10.0000 mg | ORAL_TABLET | Freq: Every evening | ORAL | 0 refills | Status: DC | PRN

## 2023-10-03 NOTE — Patient Instructions (Signed)
 The numbness problem on the outer thigh area is meralgia paresthetica.  I sent a prescription for Flexeril which is a muscle relaxer type.  She can take at night to see if this helps with the back pain and leg crampings and sleep disturbances.  I sent a prescription for triamcinolone  0.5% this is a stronger potency steroid cream than the over-the-counter hydrocortisone you can try putting on the affected skin rash areas up to twice daily, leave in place for at least 15 minutes without washing for absorption.  I am checking multiple blood test for evidence of active inflammation or RA that would be amenable to resuming an immunosuppressive medication like the Hyrimoz.

## 2023-10-03 NOTE — Progress Notes (Unsigned)
 Office Visit Note  Patient: [name]             Date of Birth: [birth year]           MRN: [id number]             PCP: Kennyth Worth HERO, MD Referring: Kennyth Worth HERO, MD Visit Date: 10/03/2023   Subjective:  New Patient (Initial Visit)   Discussed the use of AI scribe software for clinical note transcription with the patient, who gave verbal consent to proceed.  History of Present Illness   [name] is a 63 year old female with psoriatic arthritis who presents with worsening joint pain and skin rashes. [name] was referred by Dr. Strazanac from [facility] for continued management of her arthritis due to change in insurance since her husband retired.  [name] has a history of inflammatory arthritis, with worsening joint pain. [name] is on a low dosage of narcotic pain medication, taken sparingly due to her dislike of pills. [name] has had two spinal injections in the past, which provided temporary relief. [name] previously used Hyrimoz injections but stopped two months ago due to skin rashes, leading to increased pain since discontinuation.  [name] experiences skin rashes primarily on her back and other areas, which have slightly improved with over-the-counter hydrocortisone 1% cream. The rashes are sometimes itchy. A prescription lotion from her primary care doctor was ineffective. [name] has an upcoming appointment with a dermatologist in February.  [name] has swelling in her hands, which never return to normal size, and pain that sometimes makes movement difficult. [name] has not used steroid medications recently. [name] has taken prednisone a long time ago without major complications recalled. [name] has not had injections in other joints like the knee or shoulder.  [name] experiences leg cramps at night, which are painful and disrupt her sleep. [name] also has bladder issues causing her to wake up twice a night to urinate, contributing to her disrupted sleep.  [name] has a history of  varicose veins and venous stasis, with a vein removal procedure in the past. [name] reports numbness and temperature changes in her right leg, with sharp, knife-like pain in specific areas that require her to sit down immediately.  [name] is currently taking norco 5 mg usually one in the morning and sometimes one in the evening based on symptoms, with concern to avoid dependency.     DMARD Hx Hyrimoz  HCQ 2024 ineffective  Activities of Daily Living:  Patient reports morning stiffness for 5 minutes.   Patient Reports nocturnal pain.  Difficulty dressing/grooming: Denies Difficulty climbing stairs: Reports Difficulty getting out of chair: Reports Difficulty using hands for taps, buttons, cutlery, and/or writing: Denies  Review of Systems  Constitutional:  Positive for fatigue.  HENT:  Negative for mouth sores and mouth dryness.   Eyes:  Negative for dryness.  Respiratory:  Negative for shortness of breath.   Cardiovascular:  Negative for chest pain and palpitations.  Gastrointestinal:  Negative for blood in stool, constipation and diarrhea.  Endocrine: Positive for increased urination.  Genitourinary:  Negative for involuntary urination.  Musculoskeletal:  Positive for joint pain, joint pain, joint swelling, myalgias, muscle weakness, morning stiffness, muscle tenderness and myalgias. Negative for gait problem.  Skin:  Negative for color change, rash, hair loss and sensitivity to sunlight.  Allergic/Immunologic: Negative for susceptible to infections.  Neurological:  Negative for dizziness and headaches.  Hematological:  Negative for swollen glands.  Psychiatric/Behavioral:  Negative for depressed  mood and sleep disturbance. The patient is nervous/anxious.     PMFS History:  Patient Active Problem List   Diagnosis Date Noted  . Stasis ulcer of left lower extremity (HCC) 10/03/2023  . Vaginal dryness 10/03/2023  . History of hysterectomy 10/03/2023  . Erythrocytosis 07/31/2023  .  Rheumatoid arthritis (HCC) 11/26/2022  . Dyslipidemia 04/12/2022  . T2DM (type 2 diabetes mellitus) (HCC) 04/09/2022  . Osteopenia 09/17/2021  . Lumbar canal stenosis 08/16/2021  . Lumbar radiculopathy 05/16/2020  . Bilateral hip pain 08/20/2019  . Meralgia paresthetica of right side 04/28/2018  . Pleural effusion 04/02/2018  . Essential hypertension 04/02/2018  . Skin lesion 04/02/2018  . History of bladder cancer 04/02/2018  . Malignant 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) 11/21/2016    Past Medical History:  Diagnosis Date  . Arthritis   . Bladder cancer (HCC)   . Diabetes mellitus without complication (HCC)   . Hypertension     Family History  Problem Relation Age of Onset  . Stroke Mother   . Diabetes Father    Past Surgical History:  Procedure Laterality Date  . ABDOMINAL HYSTERECTOMY    . BLADDER SURGERY     bladder cancer   Social History   Social History Narrative  . Not on file   Immunization History  Administered Date(s) Administered  . Influenza, Seasonal, Injecte, Preservative Fre 11/26/2022  . Influenza-Unspecified 11/30/2017, 01/11/2020  . MMR 11/08/1996  . PFIZER(Purple Top)SARS-COV-2 Vaccination 11/06/2019, 11/27/2019, 01/16/2020  . Td 07/07/1996, 11/08/1996, 05/25/1997  . Tdap 04/09/2022  . Zoster Recombinant(Shingrix ) 04/09/2022, 06/11/2022     Objective: Vital Signs: BP 111/72 (BP Location: Left Arm, Patient Position: Sitting, Cuff Size: Large)   Pulse 74   Ht 5' 8 (1.727 m)   Wt 246 lb (111.6 kg)   BMI 37.40 kg/m    Physical Exam Constitutional:      Appearance: [name] is obese.  Eyes:     Conjunctiva/sclera: Conjunctivae normal.  Cardiovascular:     Rate and Rhythm: Normal rate and regular rhythm.  Pulmonary:     Effort: Pulmonary effort is normal.     Breath sounds: Normal breath sounds.  Musculoskeletal:     Right lower leg: No edema.     Left lower leg: No edema.  Lymphadenopathy:     Cervical: No cervical  adenopathy.  Skin:    General: Skin is warm and dry.     Findings: Rash present.     Comments: Numerous superficial venous varicosities on both legs from the level of the knee and below, hyperpigmented patch on medial left ankle No pitting edema Dystrophic nail changes at second DIP joint on both hands and milder on several left hand fingers No pitting, normal-appearing nailfold capillaroscopy  Neurological:     Mental Status: [name] is alert.  Psychiatric:        Mood and Affect: Mood normal.      Musculoskeletal Exam:  Neck tenderness to pressure without pain with movement, lateral rotation is more restricted left worse than right Widespread paraspinal muscle tenderness to pressure throughout the upper and lower back, tenderness to pressure over traps and scalenes with no palpable knots Elbows full ROM no tenderness or swelling Wrists full ROM no tenderness or swelling Fingers full ROM no tenderness or swelling Hip tenderness to pressure laterally at the greater trochanter no radiation Knees full ROM no tenderness or swelling, bilateral patellofemoral crepitus Ankles full ROM no tenderness or swelling   Investigation: No additional findings.  Imaging: No results found.  Recent Labs: Lab Results  Component Value Date   WBC 10.0 08/19/2023   HGB 16.3 (H) 08/19/2023   PLT 144 (L) 08/19/2023   NA 138 05/26/2023   K 4.5 05/26/2023   CL 102 05/26/2023   CO2 28 05/26/2023   GLUCOSE 97 05/26/2023   BUN 14 05/26/2023   CREATININE 0.80 05/26/2023   BILITOT 0.4 05/26/2023   ALKPHOS 81 05/26/2023   AST 18 05/26/2023   ALT 17 05/26/2023   PROT 6.9 05/26/2023   ALBUMIN 4.2 05/26/2023   CALCIUM 9.2 05/26/2023   GFRAA >60 03/31/2018    Speciality Comments: No specialty comments available.  Procedures:  No procedures performed Allergies: Patient has no known allergies.   Assessment / Plan:     Visit Diagnoses: Inflammatory arthritis - Plan: Sedimentation rate, C-reactive  protein, Rheumatoid factor, Cyclic citrul peptide antibody, IgG  Skin lesion - Plan: triamcinolone  ointment (KENALOG ) 0.5 %  Generalized osteoarthritis of multiple sites - Plan: cyclobenzaprine  (FLEXERIL ) 10 MG tablet  Meralgia paresthetica of right side  High risk medication use - Plan: CBC with Differential/Platelet, Comprehensive metabolic panel with GFR  Orders: Orders Placed This Encounter  Procedures  . Sedimentation rate  . C-reactive protein  . Rheumatoid factor  . Cyclic citrul peptide antibody, IgG  . CBC with Differential/Platelet  . Comprehensive metabolic panel with GFR   Meds ordered this encounter  Medications  . triamcinolone  ointment (KENALOG ) 0.5 %    Sig: Apply 1 Application topically 2 (two) times daily.    Dispense:  30 g    Refill:  0  . cyclobenzaprine  (FLEXERIL ) 10 MG tablet    Sig: Take 1 tablet (10 mg total) by mouth at bedtime as needed.    Dispense:  30 tablet    Refill:  0    Face-to-face time spent with patient was *** minutes. Greater than 50% of time was spent in counseling and coordination of care.  Follow-Up Instructions: Return in about 4 weeks (around 10/31/2023) for New pt ?PsA f/u 62mo.   Lonni LELON Ester, MD  Note - This record has been created using [employer].  Chart creation errors have been sought, but may not always  have been located. Such creation errors do not reflect on  the standard of medical care.

## 2023-10-05 ENCOUNTER — Other Ambulatory Visit: Payer: Self-pay | Admitting: Family Medicine

## 2023-10-06 LAB — COMPREHENSIVE METABOLIC PANEL WITH GFR
AG Ratio: 1.6 (calc) (ref 1.0–2.5)
ALT: 11 U/L (ref 6–29)
AST: 15 U/L (ref 10–35)
Albumin: 4.1 g/dL (ref 3.6–5.1)
Alkaline phosphatase (APISO): 89 U/L (ref 37–153)
BUN: 14 mg/dL (ref 7–25)
CO2: 29 mmol/L (ref 20–32)
Calcium: 9.2 mg/dL (ref 8.6–10.4)
Chloride: 103 mmol/L (ref 98–110)
Creat: 0.74 mg/dL (ref 0.50–1.05)
Globulin: 2.6 g/dL (ref 1.9–3.7)
Glucose, Bld: 91 mg/dL (ref 65–99)
Potassium: 4.6 mmol/L (ref 3.5–5.3)
Sodium: 139 mmol/L (ref 135–146)
Total Bilirubin: 0.3 mg/dL (ref 0.2–1.2)
Total Protein: 6.7 g/dL (ref 6.1–8.1)
eGFR: 93 mL/min/1.73m2 (ref >=60)

## 2023-10-06 LAB — RHEUMATOID FACTOR: Rheumatoid fact SerPl-aCnc: 26 [IU]/mL — ABNORMAL HIGH (ref <14)

## 2023-10-06 LAB — CBC WITH DIFFERENTIAL/PLATELET
Absolute Lymphocytes: 2411 {cells}/uL (ref 850–3900)
Absolute Monocytes: 616 {cells}/uL (ref 200–950)
Basophils Absolute: 53 {cells}/uL (ref 0–200)
Basophils Relative: 0.6 %
Eosinophils Absolute: 361 {cells}/uL (ref 15–500)
Eosinophils Relative: 4.1 %
HCT: 47.6 % — ABNORMAL HIGH (ref 35.0–45.0)
Hemoglobin: 15.4 g/dL (ref 11.7–15.5)
MCH: 30.4 pg (ref 27.0–33.0)
MCHC: 32.4 g/dL (ref 32.0–36.0)
MCV: 94.1 fL (ref 80.0–100.0)
MPV: 11.8 fL (ref 7.5–12.5)
Monocytes Relative: 7 %
Neutro Abs: 5359 {cells}/uL (ref 1500–7800)
Neutrophils Relative %: 60.9 %
Platelets: 162 Thousand/uL (ref 140–400)
RBC: 5.06 Million/uL (ref 3.80–5.10)
RDW: 12.4 % (ref 11.0–15.0)
Total Lymphocyte: 27.4 %
WBC: 8.8 Thousand/uL (ref 3.8–10.8)

## 2023-10-06 LAB — C-REACTIVE PROTEIN: CRP: 9 mg/L — ABNORMAL HIGH (ref <8.0)

## 2023-10-06 LAB — CYCLIC CITRUL PEPTIDE ANTIBODY, IGG: Cyclic Citrullin Peptide Ab: <16 U

## 2023-10-06 LAB — SEDIMENTATION RATE: Sed Rate: 22 mm/h (ref 0–30)

## 2023-10-08 DIAGNOSIS — I10 Essential (primary) hypertension: Secondary | ICD-10-CM | POA: Diagnosis not present

## 2023-10-08 DIAGNOSIS — M4716 Other spondylosis with myelopathy, lumbar region: Secondary | ICD-10-CM | POA: Diagnosis not present

## 2023-10-08 DIAGNOSIS — M069 Rheumatoid arthritis, unspecified: Secondary | ICD-10-CM | POA: Diagnosis not present

## 2023-10-08 DIAGNOSIS — Z6837 Body mass index (BMI) 37.0-37.9, adult: Secondary | ICD-10-CM | POA: Diagnosis not present

## 2023-10-08 DIAGNOSIS — G4733 Obstructive sleep apnea (adult) (pediatric): Secondary | ICD-10-CM | POA: Diagnosis not present

## 2023-10-08 DIAGNOSIS — E6609 Other obesity due to excess calories: Secondary | ICD-10-CM | POA: Diagnosis not present

## 2023-10-20 DIAGNOSIS — Z6837 Body mass index (BMI) 37.0-37.9, adult: Secondary | ICD-10-CM | POA: Diagnosis not present

## 2023-10-20 DIAGNOSIS — K5730 Diverticulosis of large intestine without perforation or abscess without bleeding: Secondary | ICD-10-CM | POA: Diagnosis not present

## 2023-10-20 DIAGNOSIS — Z860101 Personal history of adenomatous and serrated colon polyps: Secondary | ICD-10-CM | POA: Diagnosis not present

## 2023-10-20 DIAGNOSIS — R030 Elevated blood-pressure reading, without diagnosis of hypertension: Secondary | ICD-10-CM | POA: Diagnosis not present

## 2023-10-20 DIAGNOSIS — K648 Other hemorrhoids: Secondary | ICD-10-CM | POA: Diagnosis not present

## 2023-10-20 DIAGNOSIS — E6609 Other obesity due to excess calories: Secondary | ICD-10-CM | POA: Diagnosis not present

## 2023-10-22 DIAGNOSIS — G4733 Obstructive sleep apnea (adult) (pediatric): Secondary | ICD-10-CM | POA: Diagnosis not present

## 2023-10-30 DIAGNOSIS — Z6837 Body mass index (BMI) 37.0-37.9, adult: Secondary | ICD-10-CM | POA: Diagnosis not present

## 2023-10-30 DIAGNOSIS — I10 Essential (primary) hypertension: Secondary | ICD-10-CM | POA: Diagnosis not present

## 2023-10-30 DIAGNOSIS — M069 Rheumatoid arthritis, unspecified: Secondary | ICD-10-CM | POA: Diagnosis not present

## 2023-10-30 DIAGNOSIS — M4716 Other spondylosis with myelopathy, lumbar region: Secondary | ICD-10-CM | POA: Diagnosis not present

## 2023-10-30 DIAGNOSIS — E1169 Type 2 diabetes mellitus with other specified complication: Secondary | ICD-10-CM | POA: Diagnosis not present

## 2023-10-30 DIAGNOSIS — E669 Obesity, unspecified: Secondary | ICD-10-CM | POA: Diagnosis not present

## 2023-10-30 DIAGNOSIS — Z79899 Other long term (current) drug therapy: Secondary | ICD-10-CM | POA: Diagnosis not present

## 2023-10-31 ENCOUNTER — Other Ambulatory Visit: Payer: Self-pay | Admitting: Internal Medicine

## 2023-10-31 ENCOUNTER — Ambulatory Visit: Admitting: Family Medicine

## 2023-10-31 DIAGNOSIS — M159 Polyosteoarthritis, unspecified: Secondary | ICD-10-CM

## 2023-10-31 NOTE — Telephone Encounter (Signed)
 Last Fill: 10/03/2023  Next Visit: 11/07/2023  Last Visit: 10/03/2023  Dx: Degenerative arthritis   Current Dose per office note on 10/03/2023: flexeril  10 mg at bedtime PRN   Okay to refill Flexeril ?

## 2023-11-03 ENCOUNTER — Ambulatory Visit: Payer: Self-pay | Admitting: Family Medicine

## 2023-11-03 ENCOUNTER — Encounter: Payer: Self-pay | Admitting: Family Medicine

## 2023-11-03 ENCOUNTER — Ambulatory Visit: Admitting: Family Medicine

## 2023-11-03 VITALS — BP 136/76 | HR 84 | Temp 97.7°F | Ht 68.0 in | Wt 245.8 lb

## 2023-11-03 DIAGNOSIS — I152 Hypertension secondary to endocrine disorders: Secondary | ICD-10-CM | POA: Diagnosis not present

## 2023-11-03 DIAGNOSIS — D751 Secondary polycythemia: Secondary | ICD-10-CM | POA: Diagnosis not present

## 2023-11-03 DIAGNOSIS — E1159 Type 2 diabetes mellitus with other circulatory complications: Secondary | ICD-10-CM

## 2023-11-03 DIAGNOSIS — E1169 Type 2 diabetes mellitus with other specified complication: Secondary | ICD-10-CM | POA: Diagnosis not present

## 2023-11-03 DIAGNOSIS — M069 Rheumatoid arthritis, unspecified: Secondary | ICD-10-CM | POA: Diagnosis not present

## 2023-11-03 LAB — POCT GLYCOSYLATED HEMOGLOBIN (HGB A1C): Hemoglobin A1C: 5.8 % — AB (ref 4.0–5.6)

## 2023-11-03 LAB — MICROALBUMIN / CREATININE URINE RATIO
Creatinine,U: 132.3 mg/dL
Microalb Creat Ratio: 9.4 mg/g (ref 0.0–30.0)
Microalb, Ur: 1.2 mg/dL (ref 0.0–1.9)

## 2023-11-03 MED ORDER — TIRZEPATIDE 10 MG/0.5ML ~~LOC~~ SOAJ
10.0000 mg | SUBCUTANEOUS | 1 refills | Status: AC
Start: 2023-11-03 — End: ?

## 2023-11-03 NOTE — Patient Instructions (Signed)
 It was very nice to see you today!  VISIT SUMMARY: Today, we reviewed your diabetes management and psoriatic arthritis symptoms. Your diabetes is well-controlled, and we discussed increasing your medication dose to help with weight loss. We also talked about your skin symptoms and the need for further evaluation by a specialist.  YOUR PLAN: TYPE 2 DIABETES MELLITUS: Your diabetes is well-controlled with an A1c of 5.8. You are using Mounjaro , which helps with blood sugar control and weight loss. -Increase Mounjaro  to 10 mg weekly. -Monitor for side effects, especially nausea.  ESSENTIAL HYPERTENSION: Your blood pressure is well-controlled. -Continue current management.  Return in about 3 months (around 02/03/2024) for Follow Up.   Take care, Dr Kennyth  PLEASE NOTE:  If you had any lab tests, please let us  know if you have not heard back within a few days. You may see your results on mychart before we have a chance to review them but we will give you a call once they are reviewed by us .   If we ordered any referrals today, please let us  know if you have not heard from their office within the next week.   If you had any urgent prescriptions sent in today, please check with the pharmacy within an hour of our visit to make sure the prescription was transmitted appropriately.   Please try these tips to maintain a healthy lifestyle:  Eat at least 3 REAL meals and 1-2 snacks per day.  Aim for no more than 5 hours between eating.  If you eat breakfast, please do so within one hour of getting up.   Each meal should contain half fruits/vegetables, one quarter protein, and one quarter carbs (no bigger than a computer mouse)  Cut down on sweet beverages. This includes juice, soda, and sweet tea.   Drink at least 1 glass of water with each meal and aim for at least 8 glasses per day  Exercise at least 150 minutes every week.

## 2023-11-03 NOTE — Assessment & Plan Note (Signed)
 Follows with oncology.  Recent workup there was negative.  They recommended periodic blood donation.

## 2023-11-03 NOTE — Assessment & Plan Note (Signed)
 A1c well-controlled 5.8.  She is interested in increasing the dose to help with weight loss.  We will go to 2 mg weekly.  We discussed side effects.  Recheck in 3 months.

## 2023-11-03 NOTE — Progress Notes (Signed)
 Urine sample is normal.  We can recheck in a year.

## 2023-11-03 NOTE — Progress Notes (Signed)
   Arial Zana Bondarenko is a 63 y.o. female who presents today for an office visit.  Assessment/Plan:  Chronic Problems Addressed Today: T2DM (type 2 diabetes mellitus) (HCC) A1c well-controlled 5.8.  She is interested in increasing the dose to help with weight loss.  We will go to 2 mg weekly.  We discussed side effects.  Recheck in 3 months.  Hypertension associated with diabetes (HCC) At goal today on lisinopril  20 mg daily  Rheumatoid arthritis (HCC) Follows with rheumatology.  Will be seeing them again later this week in a couple of days.  Recently had labs done.  They or potentially worried about psoriatic arthritis as well.  Will defer further management to rheumatology.     Subjective:  HPI:  See assessment / plan for status of chronic conditions.   Discussed the use of AI scribe software for clinical note transcription with the patient, who gave verbal consent to proceed.  History of Present Illness Nancey Zana Fomby Zana is a 64 year old female who presents for follow-up of her diabetes management and psoriatic arthritis symptoms.  Her diabetes is well-controlled with an A1c of 5.8. She is using Mounjaro , an injectable medication, and reports that it is working better at the higher dose, but she still desires to lose more weight. The medication is working better at the higher dose, but she still desires to lose more weight.  She is currently being evaluated for psoriatic arthritis and is taking Flexeril  at night and using triamcinolone  cream for her symptoms, which include itchy, pimple-like lesions on her legs and palms. She has also tried over-the-counter cortisone and anti-itch creams but is not satisfied with the results.         Objective:  Physical Exam: BP 136/76   Pulse 84   Temp 97.7 F (36.5 C) (Temporal)   Ht 5' 8 (1.727 m)   Wt 245 lb 12.8 oz (111.5 kg)   SpO2 95%   BMI 37.37 kg/m   Wt Readings from Last 3 Encounters:  11/03/23 245 lb 12.8 oz  (111.5 kg)  10/03/23 246 lb (111.6 kg)  08/19/23 250 lb 9.6 oz (113.7 kg)    Gen: No acute distress, resting comfortably CV: Regular rate and rhythm with no murmurs appreciated Pulm: Normal work of breathing, clear to auscultation bilaterally with no crackles, wheezes, or rhonchi Neuro: Grossly normal, moves all extremities Psych: Normal affect and thought content      Yaelis Scharfenberg M. Kennyth, MD 11/03/2023 8:44 AM

## 2023-11-03 NOTE — Assessment & Plan Note (Signed)
At goal today on lisinopril 20 mg daily.

## 2023-11-03 NOTE — Assessment & Plan Note (Signed)
 Follows with rheumatology.  Will be seeing them again later this week in a couple of days.  Recently had labs done.  They or potentially worried about psoriatic arthritis as well.  Will defer further management to rheumatology.

## 2023-11-04 DIAGNOSIS — Z79899 Other long term (current) drug therapy: Secondary | ICD-10-CM | POA: Diagnosis not present

## 2023-11-06 NOTE — Progress Notes (Signed)
 Office Visit Note  Patient: [name]             Date of Birth: 1961-01-21           MRN: [id number]             PCP: Kennyth Worth HERO, MD Referring: Kennyth Worth HERO, MD Visit Date: 11/07/2023   Subjective:  Follow-up (Patient states she had bumps pop up on both of her hands. )   Discussed the use of AI scribe software for clinical note transcription with the patient, who gave verbal consent to proceed.  History of Present Illness   [name] is a 63 year old female with psoriatic arthritis who presents with worsening joint pain and skin symptoms.  She is experiencing a resurgence of psoriasis, particularly on her hands and legs, characterized by peeling and itching. Although the condition is not as severe as in the past, it remains bothersome. She has also used topical steroids for her skin, applying them to affected areas such as her back and new spots as they appear.  She describes significant joint pain, especially in the arm, which can become overwhelming. The pain is exacerbated by prolonged sitting. She experiences stiffness in the morning and throughout the day, describing it as 'really crazy'.  Previous blood work indicated slightly elevated inflammation markers, including C-reactive protein.   She is taking norco, mobic , and flexeril  but with only partial benefit.     Previous HPI 10/03/23 [name] is a 63 year old female with psoriatic arthritis who presents with worsening joint pain and skin rashes. She was referred by Dr. Strazanac from [facility] for continued management of her arthritis due to change in insurance since her husband retired.   She has a history of inflammatory arthritis, with worsening joint pain. She is on a low dosage of narcotic pain medication, taken sparingly due to her dislike of pills. She has had two spinal injections in the past, which provided temporary relief. She previously used  Hyrimoz injections but stopped two months ago due to skin rashes, leading to increased pain since discontinuation.   She experiences skin rashes primarily on her back and other areas, which have slightly improved with over-the-counter hydrocortisone 1% cream. The rashes are sometimes itchy. A prescription lotion from her primary care doctor was ineffective. She has an upcoming appointment with a dermatologist in February.   She has swelling in her hands, which never return to normal size, and pain that sometimes makes movement difficult. She has not used steroid medications recently. She has taken prednisone a long time ago without major complications recalled. She has not had injections in other joints like the knee or shoulder.   She experiences leg cramps at night, which are painful and disrupt her sleep. She also has bladder issues causing her to wake up twice a night to urinate, contributing to her disrupted sleep.   She has a history of varicose veins and venous stasis, with a vein removal procedure in the past. She reports numbness and temperature changes in her right leg, with sharp, knife-like pain in specific areas that require her to sit down immediately.   She is currently taking norco 5 mg usually one in the morning and sometimes one in the evening based on symptoms, with concern to avoid dependency.     DMARD Hx Hyrimoz  HCQ 2024 ineffective   Review of Systems  Constitutional:  Negative for fatigue.  HENT:  Negative for mouth sores and mouth dryness.   Eyes:  Negative for dryness.  Respiratory:  Negative for shortness of breath.   Cardiovascular:  Negative for chest pain and palpitations.  Gastrointestinal:  Negative for blood in stool, constipation and diarrhea.  Endocrine: Negative for increased urination.  Genitourinary:  Negative for involuntary urination.  Musculoskeletal:  Positive for joint pain, gait problem, joint pain, joint swelling, myalgias, muscle weakness,  morning stiffness, muscle tenderness and myalgias.  Skin:  Positive for rash. Negative for color change, hair loss and sensitivity to sunlight.  Allergic/Immunologic: Negative for susceptible to infections.  Neurological:  Negative for dizziness and headaches.  Hematological:  Negative for swollen glands.  Psychiatric/Behavioral:  Negative for depressed mood and sleep disturbance. The patient is not nervous/anxious.     PMFS History:  Patient Active Problem List   Diagnosis Date Noted   Other psoriasis 11/07/2023   High risk medication use 11/07/2023   Stasis ulcer of left lower extremity (HCC) 10/03/2023   Vaginal dryness 10/03/2023   History of hysterectomy 10/03/2023   Erythrocytosis 07/31/2023   Rheumatoid arthritis (HCC) 11/26/2022   Dyslipidemia 04/12/2022   T2DM (type 2 diabetes mellitus) (HCC) 04/09/2022   Osteopenia 09/17/2021   Lumbar canal stenosis 08/16/2021   Lumbar radiculopathy 05/16/2020   Bilateral hip pain 08/20/2019   Meralgia paresthetica of right side 04/28/2018   Pleural effusion 04/02/2018   Hypertension associated with diabetes (HCC) 04/02/2018   Skin lesion 04/02/2018   History of bladder cancer 04/02/2018   Malignant 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) 11/21/2016    Past Medical History:  Diagnosis Date   Arthritis    Bladder cancer (HCC)    Diabetes mellitus without complication (HCC)    Hypertension     Family History  Problem Relation Age of Onset   Stroke Mother    Diabetes Father    Past Surgical History:  Procedure Laterality Date   ABDOMINAL HYSTERECTOMY     BLADDER SURGERY     bladder cancer   Social History   Social History Narrative   Not on file   Immunization History  Administered Date(s) Administered   Influenza, Seasonal, Injecte, Preservative Fre 11/26/2022   Influenza-Unspecified 11/30/2017, 01/11/2020   MMR 11/08/1996   PFIZER(Purple Top)SARS-COV-2 Vaccination 11/06/2019, 11/27/2019, 01/16/2020   Td  07/07/1996, 11/08/1996, 05/25/1997   Tdap 04/09/2022   Zoster Recombinant(Shingrix ) 04/09/2022, 06/11/2022     Objective: Vital Signs: BP 129/73 (BP Location: Left Arm, Patient Position: Sitting, Cuff Size: Normal)   Pulse 76   Resp 14   Ht 5' 8 (1.727 m)   Wt 246 lb (111.6 kg)   BMI 37.40 kg/m    Physical Exam Eyes:     Conjunctiva/sclera: Conjunctivae normal.  Cardiovascular:     Rate and Rhythm: Normal rate and regular rhythm.  Pulmonary:     Effort: Pulmonary effort is normal.     Breath sounds: Normal breath sounds.  Lymphadenopathy:     Cervical: No cervical adenopathy.  Skin:    General: Skin is warm and dry.     Findings: Rash present.     Comments: Superficial venous varicosities on both legs from the level of the knee and below, hyperpigmented patch on medial left ankle No pitting edema Dystrophic nail changes at second DIP joint on both hands and milder on several left hand fingers Few papule rashes on palms Rash on medial side of right foot   Neurological:     Mental Status: She is  alert.  Psychiatric:        Mood and Affect: Mood normal.      Musculoskeletal Exam:  Widespread paraspinal muscle tenderness to pressure throughout the upper and lower back, tenderness to pressure over traps and scalenes with no palpable knots Elbows full ROM no tenderness or swelling Wrists full ROM no tenderness or swelling Fingers full ROM no tenderness or swelling Hip tenderness to pressure laterally at the greater trochanter no radiation Knees full ROM no tenderness or swelling, bilateral patellofemoral crepitus Ankles full ROM right achilles tendon nodule   Investigation: No additional findings.  Imaging: No results found.  Recent Labs: Lab Results  Component Value Date   WBC 8.8 10/03/2023   HGB 15.4 10/03/2023   PLT 162 10/03/2023   NA 139 10/03/2023   K 4.6 10/03/2023   CL 103 10/03/2023   CO2 29 10/03/2023   GLUCOSE 91 10/03/2023   BUN 14 10/03/2023    CREATININE 0.74 10/03/2023   BILITOT 0.3 10/03/2023   ALKPHOS 81 05/26/2023   AST 15 10/03/2023   ALT 11 10/03/2023   PROT 6.7 10/03/2023   ALBUMIN 4.2 05/26/2023   CALCIUM 9.2 10/03/2023   GFRAA >60 03/31/2018    Speciality Comments: No specialty comments available.  Procedures:  No procedures performed Allergies: Patient has no known allergies.   Assessment / Plan:     Visit Diagnoses: Rheumatoid arthritis involving multiple sites with positive rheumatoid factor (HCC) Significant pain and stiffness with morning predominance. Nodule on right Achilles tendon. Peeling and itching on leg. Hyrimoz exacerbated psoriasis but improved joint symptoms. With her positive RA serology would prefer trying another TNF inhibitor versus switching to different interleukin class biologic for PsA. - Start ENbrel 50 mg [place] weekly  Other psoriasis - Plan: triamcinolone  cream (KENALOG ) 0.1 % Topical triamcinolone  0.1% 454 g for large surface area of rashes  High risk medication use Discussed risks of Enbrel including injection site reactions, infections, malignancy, cytopenias, hepatotoxicity.  Previous tolerance of Hyrimoz except for skin rash problems not sure if medication reaction related versus exacerbation of underlying psoriasis.  Obtain recent baseline labs appropriate no pre-existing contraindication.   Orders: No orders of the defined types were placed in this encounter.  Meds ordered this encounter  Medications   triamcinolone  cream (KENALOG ) 0.1 %    Sig: Apply 1 Application topically 2 (two) times daily as needed.    Dispense:  454 g    Refill:  0     Follow-Up Instructions: Return in about 3 months (around 02/07/2024) for RA/PsA ENB start f/u 3mos.   Lonni LELON Ester, MD  Note - This record has been created using [employer].  Chart creation errors have been sought, but may not always  have been located. Such creation errors do not reflect on  the standard of medical  care.

## 2023-11-07 ENCOUNTER — Ambulatory Visit: Attending: Internal Medicine | Admitting: Internal Medicine

## 2023-11-07 ENCOUNTER — Encounter: Payer: Self-pay | Admitting: Internal Medicine

## 2023-11-07 VITALS — BP 129/73 | HR 76 | Resp 14 | Ht 68.0 in | Wt 246.0 lb

## 2023-11-07 DIAGNOSIS — L408 Other psoriasis: Secondary | ICD-10-CM | POA: Diagnosis not present

## 2023-11-07 DIAGNOSIS — M0579 Rheumatoid arthritis with rheumatoid factor of multiple sites without organ or systems involvement: Secondary | ICD-10-CM | POA: Insufficient documentation

## 2023-11-07 DIAGNOSIS — Z79899 Other long term (current) drug therapy: Secondary | ICD-10-CM | POA: Insufficient documentation

## 2023-11-07 MED ORDER — TRIAMCINOLONE ACETONIDE 0.1 % EX CREA: 1.0000 | TOPICAL_CREAM | Freq: Two times a day (BID) | CUTANEOUS | 0 refills | Status: AC | PRN

## 2023-11-19 ENCOUNTER — Telehealth: Payer: Self-pay | Admitting: Family Medicine

## 2023-11-19 NOTE — Telephone Encounter (Signed)
 [employer] faxed disability forms, to be filled out by provider. Patient requested to send it back via Fax within ASAP. Document is located in providers tray at front office.Please advise at [phone].

## 2023-11-20 NOTE — Telephone Encounter (Signed)
Placed to be reviewed in PCP office

## 2023-11-22 DIAGNOSIS — G4733 Obstructive sleep apnea (adult) (pediatric): Secondary | ICD-10-CM | POA: Diagnosis not present

## 2023-11-25 DIAGNOSIS — Z0279 Encounter for issue of other medical certificate: Secondary | ICD-10-CM

## 2023-11-25 NOTE — Telephone Encounter (Unsigned)
 Copied from CRM [id number]. Topic: General - Other >> Nov 25, 2023 12:06 PM Deaijah H wrote: Reason for CRM: Patient would like to speak directly with Dr. Kennyth regarding disability forms. Please call [phone]

## 2023-11-25 NOTE — Telephone Encounter (Signed)
 FMLA faxed to [phone] Placed to be scan in Patient chart

## 2023-11-25 NOTE — Telephone Encounter (Signed)
 Spoke with patient, aware form received and placed to be reviewed

## 2023-11-28 ENCOUNTER — Other Ambulatory Visit: Payer: Self-pay | Admitting: Internal Medicine

## 2023-11-28 DIAGNOSIS — M159 Polyosteoarthritis, unspecified: Secondary | ICD-10-CM

## 2023-11-28 NOTE — Telephone Encounter (Signed)
 Last Fill: 10/31/2023  Next Visit: 02/09/2024  Last Visit: 11/07/2023  Dx: not mentioned  Current Dose per office note on 11/07/2023: not mentioned  Okay to refill Flexeril ?

## 2023-12-01 DIAGNOSIS — M069 Rheumatoid arthritis, unspecified: Secondary | ICD-10-CM | POA: Diagnosis not present

## 2023-12-01 DIAGNOSIS — M129 Arthropathy, unspecified: Secondary | ICD-10-CM | POA: Diagnosis not present

## 2023-12-01 DIAGNOSIS — E1169 Type 2 diabetes mellitus with other specified complication: Secondary | ICD-10-CM | POA: Diagnosis not present

## 2023-12-01 DIAGNOSIS — E6609 Other obesity due to excess calories: Secondary | ICD-10-CM | POA: Diagnosis not present

## 2023-12-01 DIAGNOSIS — I10 Essential (primary) hypertension: Secondary | ICD-10-CM | POA: Diagnosis not present

## 2023-12-01 DIAGNOSIS — E669 Obesity, unspecified: Secondary | ICD-10-CM | POA: Diagnosis not present

## 2023-12-01 DIAGNOSIS — Z6837 Body mass index (BMI) 37.0-37.9, adult: Secondary | ICD-10-CM | POA: Diagnosis not present

## 2023-12-01 DIAGNOSIS — Z79899 Other long term (current) drug therapy: Secondary | ICD-10-CM | POA: Diagnosis not present

## 2023-12-01 DIAGNOSIS — M4716 Other spondylosis with myelopathy, lumbar region: Secondary | ICD-10-CM | POA: Diagnosis not present

## 2023-12-01 DIAGNOSIS — K5909 Other constipation: Secondary | ICD-10-CM | POA: Diagnosis not present

## 2023-12-09 DIAGNOSIS — G4733 Obstructive sleep apnea (adult) (pediatric): Secondary | ICD-10-CM | POA: Diagnosis not present

## 2023-12-09 DIAGNOSIS — E1169 Type 2 diabetes mellitus with other specified complication: Secondary | ICD-10-CM | POA: Diagnosis not present

## 2023-12-09 DIAGNOSIS — I10 Essential (primary) hypertension: Secondary | ICD-10-CM | POA: Diagnosis not present

## 2023-12-09 DIAGNOSIS — E669 Obesity, unspecified: Secondary | ICD-10-CM | POA: Diagnosis not present

## 2023-12-09 DIAGNOSIS — E6609 Other obesity due to excess calories: Secondary | ICD-10-CM | POA: Diagnosis not present

## 2023-12-09 DIAGNOSIS — M069 Rheumatoid arthritis, unspecified: Secondary | ICD-10-CM | POA: Diagnosis not present

## 2023-12-09 DIAGNOSIS — Z6836 Body mass index (BMI) 36.0-36.9, adult: Secondary | ICD-10-CM | POA: Diagnosis not present

## 2023-12-29 DIAGNOSIS — R9431 Abnormal electrocardiogram [ECG] [EKG]: Secondary | ICD-10-CM | POA: Diagnosis not present

## 2023-12-29 DIAGNOSIS — M5416 Radiculopathy, lumbar region: Secondary | ICD-10-CM | POA: Diagnosis not present

## 2023-12-29 DIAGNOSIS — R030 Elevated blood-pressure reading, without diagnosis of hypertension: Secondary | ICD-10-CM | POA: Diagnosis not present

## 2023-12-29 DIAGNOSIS — L409 Psoriasis, unspecified: Secondary | ICD-10-CM | POA: Diagnosis not present

## 2023-12-29 DIAGNOSIS — Z79899 Other long term (current) drug therapy: Secondary | ICD-10-CM | POA: Diagnosis not present

## 2023-12-29 DIAGNOSIS — Z6837 Body mass index (BMI) 37.0-37.9, adult: Secondary | ICD-10-CM | POA: Diagnosis not present

## 2023-12-29 DIAGNOSIS — R0602 Shortness of breath: Secondary | ICD-10-CM | POA: Diagnosis not present

## 2023-12-29 DIAGNOSIS — E6609 Other obesity due to excess calories: Secondary | ICD-10-CM | POA: Diagnosis not present

## 2024-01-22 DIAGNOSIS — G4733 Obstructive sleep apnea (adult) (pediatric): Secondary | ICD-10-CM | POA: Diagnosis not present

## 2024-01-26 NOTE — Progress Notes (Signed)
 Office Visit Note  Patient: [name]             Date of Birth: 06/30/1960           MRN: [id number]             PCP: Kennyth Worth HERO, MD Referring: Kennyth Worth HERO, MD Visit Date: 02/09/2024   Subjective:  Joint Pain (Nothing is helping long term pain )   Discussed the use of AI scribe software for clinical note transcription with the patient, who gave verbal consent to proceed.  History of Present Illness   [name] is a 64 y.o. female here for follow up with psoriatic arthritis who presents with worsening joint pain and skin symptoms. She remains on meloxicam  15 mg daily and gabapentin   She experiences worsening morning stiffness and joint pain, particularly in her right wrist and knees, without an apparent cause. She reports that her joints are more swollen in the morning, which she gauges by the fit of her rings. She is currently taking meloxicam  15 mg daily and gabapentin , though she is unsure of their effectiveness.  She reports worsening psoriasis on her feet, with nail changes that she attributes to a possible fungal infection. She has been using a home remedy of lemon and baking soda for the nail changes.  No recent illness since her last visit. No new skin or nail changes except for the worsening psoriasis on her feet. Her knees are very stiff but otherwise good. She has not noticed any rash on her scalp recently, although she mentions a history of scalp rash. Pain is primarily on the right side; the patient wonders if this could be because she is right-handed, but is not certain.       Previous HPI 11/07/2023 [name] is a 63 year old female with psoriatic arthritis who presents with worsening joint pain and skin symptoms. She is experiencing a resurgence of psoriasis, particularly on her hands and legs, characterized by peeling and itching. Although the condition is not as severe as in the past, it remains bothersome. She has also  used topical steroids for her skin, applying them to affected areas such as her back and new spots as they appear. She describes significant joint pain, especially in the arm, which can become overwhelming. The pain is exacerbated by prolonged sitting. She experiences stiffness in the morning and throughout the day, describing it as 'really crazy'.  Previous blood work indicated slightly elevated inflammation markers, including C-reactive protein.  She is taking norco, mobic , and flexeril  but with only partial benefit.      Previous HPI 10/03/23 [name] is a 63 year old female with psoriatic arthritis who presents with worsening joint pain and skin rashes. She was referred by Dr. Strazanac from [facility] for continued management of her arthritis due to change in insurance since her husband retired.   She has a history of inflammatory arthritis, with worsening joint pain. She is on a low dosage of narcotic pain medication, taken sparingly due to her dislike of pills. She has had two spinal injections in the past, which provided temporary relief. She previously used Hyrimoz injections but stopped two months ago due to skin rashes, leading to increased pain since discontinuation.   She experiences skin rashes primarily on her back and other areas, which have slightly improved with over-the-counter hydrocortisone 1% cream. The rashes are sometimes itchy. A prescription lotion from her primary care doctor  was ineffective. She has an upcoming appointment with a dermatologist in February.   She has swelling in her hands, which never return to normal size, and pain that sometimes makes movement difficult. She has not used steroid medications recently. She has taken prednisone a long time ago without major complications recalled. She has not had injections in other joints like the knee or shoulder.   She experiences leg cramps at night, which are painful and disrupt her sleep.  She also has bladder issues causing her to wake up twice a night to urinate, contributing to her disrupted sleep.   She has a history of varicose veins and venous stasis, with a vein removal procedure in the past. She reports numbness and temperature changes in her right leg, with sharp, knife-like pain in specific areas that require her to sit down immediately.   She is currently taking norco 5 mg usually one in the morning and sometimes one in the evening based on symptoms, with concern to avoid dependency.     DMARD Hx Hyrimoz - PsO flare HCQ  - ineffective   Labs reviewed 02/2023 HCV neg  Review of Systems  Constitutional:  Positive for fatigue.  HENT:  Negative for mouth sores and mouth dryness.   Eyes:  Negative for dryness.  Respiratory:  Negative for shortness of breath.   Cardiovascular:  Positive for palpitations. Negative for chest pain.  Gastrointestinal:  Negative for blood in stool, constipation and diarrhea.  Endocrine: Negative for increased urination.  Genitourinary:  Negative for involuntary urination.  Musculoskeletal:  Positive for joint pain, joint pain, myalgias, muscle weakness, morning stiffness, muscle tenderness and myalgias. Negative for gait problem and joint swelling.  Skin:  Negative for color change, rash, hair loss and sensitivity to sunlight.  Allergic/Immunologic: Negative for susceptible to infections.  Neurological:  Negative for dizziness and headaches.  Hematological:  Negative for swollen glands.  Psychiatric/Behavioral:  Negative for depressed mood and sleep disturbance. The patient is not nervous/anxious.     PMFS History:  Patient Active Problem List   Diagnosis Date Noted   Other psoriasis 11/07/2023   High risk medication use 11/07/2023   Stasis ulcer of left lower extremity (HCC) 10/03/2023   Vaginal dryness 10/03/2023   History of hysterectomy 10/03/2023   Erythrocytosis 07/31/2023   Rheumatoid arthritis (HCC) 11/26/2022    Dyslipidemia 04/12/2022   T2DM (type 2 diabetes mellitus) (HCC) 04/09/2022   Osteopenia 09/17/2021   Lumbar canal stenosis 08/16/2021   Lumbar radiculopathy 05/16/2020   Bilateral hip pain 08/20/2019   Meralgia paresthetica of right side 04/28/2018   Pleural effusion 04/02/2018   Hypertension associated with diabetes (HCC) 04/02/2018   Skin lesion 04/02/2018   History of bladder cancer 04/02/2018   Malignant 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) 11/21/2016    Past Medical History:  Diagnosis Date   Arthritis    Bladder cancer (HCC)    Diabetes mellitus without complication (HCC)    Hypertension     Family History  Problem Relation Age of Onset   Stroke Mother    Diabetes Father    Past Surgical History:  Procedure Laterality Date   ABDOMINAL HYSTERECTOMY     BLADDER SURGERY     bladder cancer   Social History   Social History Narrative   Not on file   Immunization History  Administered Date(s) Administered   Influenza, Seasonal, Injecte, Preservative Fre 11/26/2022   Influenza-Unspecified 11/30/2017, 01/11/2020   MMR 11/08/1996   PFIZER(Purple Top)SARS-COV-2 Vaccination  11/06/2019, 11/27/2019, 01/16/2020   Td 07/07/1996, 11/08/1996, 05/25/1997   Tdap 04/09/2022   Zoster Recombinant(Shingrix ) 04/09/2022, 06/11/2022     Objective: Vital Signs: BP 116/70   Pulse 76   Temp (!) 97.3 F (36.3 C)   Resp 16   Ht 5' 8 (1.727 m)   Wt 245 lb (111.1 kg)   BMI 37.25 kg/m    Physical Exam Eyes:     Conjunctiva/sclera: Conjunctivae normal.  Cardiovascular:     Rate and Rhythm: Normal rate and regular rhythm.  Pulmonary:     Effort: Pulmonary effort is normal.     Breath sounds: Normal breath sounds.  Lymphadenopathy:     Cervical: No cervical adenopathy.  Skin:    General: Skin is warm and dry.     Findings: Rash present.     Comments: Superficial venous varicosities on both legs from the level of the knee and below, hyperpigmented patch on medial  left ankle No pitting edema Dystrophic nail changes at second DIP joint on both hands and milder on several left hand fingers Few papule rashes on palms Rash on medial side of right foot  Neurological:     Mental Status: She is alert.  Psychiatric:        Mood and Affect: Mood normal.      Musculoskeletal Exam:  Widespread paraspinal muscle tenderness to pressure throughout the upper and lower back, tenderness to pressure over traps and scalenes with no palpable knots Elbows full ROM no tenderness or swelling Wrists full ROM, right wrist mild swelling, tenderness to pressure at radiocarpal joint more on dorsal side Fingers full ROM no tenderness or swelling Hip tenderness to pressure laterally at the greater trochanter no radiation Knees full ROM no tenderness or swelling, bilateral patellofemoral crepitus Ankles full ROM, bilateral achilles tendon nodules with tenderness to pressure  Investigation: No additional findings.  Imaging: No results found.  Recent Labs: Lab Results  Component Value Date   WBC 8.8 10/03/2023   HGB 15.4 10/03/2023   PLT 162 10/03/2023   NA 139 10/03/2023   K 4.6 10/03/2023   CL 103 10/03/2023   CO2 29 10/03/2023   GLUCOSE 91 10/03/2023   BUN 14 10/03/2023   CREATININE 0.74 10/03/2023   BILITOT 0.3 10/03/2023   ALKPHOS 81 05/26/2023   AST 15 10/03/2023   ALT 11 10/03/2023   PROT 6.7 10/03/2023   ALBUMIN 4.2 05/26/2023   CALCIUM 9.2 10/03/2023   GFRAA >60 03/31/2018    Speciality Comments: No specialty comments available.  Procedures:  No procedures performed Allergies: Patient has no known allergies.   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 Previous treatment with meloxicam  and gabapentin  provided limited relief. Enbrel  considered as a new treatment option to improve arthritis and skin rashes. She agreed to try Enbrel  despite previous issues with Humira. - Initiated Enbrel  injection 50 mg  once weekly. - Rechecked inflammation numbers and baseline tests for disease activity assessment, clinically looks worse than last visit - Continue meloxicam  15 mg daily.  Other psoriasis Starting TNF inhibitor as above. Continue triamcinolone  0.1% PRN. Recommended trying addition of topical zinc oxide cream on nails.  High risk medication use - Plan: Hepatitis B core  antibody, IgM, Hepatitis B surface antigen, QuantiFERON-TB Gold Plus Reviewed risks of Enbrel  including injection site reaction, infection, cytopenias, hepatotoxicity, or malginancy with long term use. No preexisting contraindication or history of cancer or heart failure. Previously tolerated adalimumab without issue. - checking HBV screening, TB screening baseline for new enbrel  start  Venous stasis dermatitis with lower extremity hyperpigmentation and varicose veins Venous stasis dermatitis with lower extremity hyperpigmentation and varicose veins, likely due to venous stasis. No significant improvement with previous topical antifungal treatments.     Orders: Orders Placed This Encounter  Procedures   Hepatitis B core antibody, IgM   Hepatitis B surface antigen   QuantiFERON-TB Gold Plus   Sedimentation rate   C-reactive protein   Meds ordered this encounter  Medications   meloxicam  (MOBIC ) 15 MG tablet    Sig: Take 1 tablet (15 mg total) by mouth daily.    Dispense:  90 tablet    Refill:  0     Follow-Up Instructions: Return in about 3 months (around 05/11/2024) for PsA ENB start f/u 3mos.   Lonni LELON Ester, MD  Note - This record has been created using [employer].  Chart creation errors  have been sought, but may not always  have been located. Such creation errors do not reflect on  the standard of medical care.

## 2024-01-27 DIAGNOSIS — M5416 Radiculopathy, lumbar region: Secondary | ICD-10-CM | POA: Diagnosis not present

## 2024-01-27 DIAGNOSIS — R9431 Abnormal electrocardiogram [ECG] [EKG]: Secondary | ICD-10-CM | POA: Diagnosis not present

## 2024-01-27 DIAGNOSIS — E6609 Other obesity due to excess calories: Secondary | ICD-10-CM | POA: Diagnosis not present

## 2024-01-27 DIAGNOSIS — Z79899 Other long term (current) drug therapy: Secondary | ICD-10-CM | POA: Diagnosis not present

## 2024-01-27 DIAGNOSIS — Z6837 Body mass index (BMI) 37.0-37.9, adult: Secondary | ICD-10-CM | POA: Diagnosis not present

## 2024-02-06 DIAGNOSIS — M069 Rheumatoid arthritis, unspecified: Secondary | ICD-10-CM | POA: Diagnosis not present

## 2024-02-06 DIAGNOSIS — R942 Abnormal results of pulmonary function studies: Secondary | ICD-10-CM | POA: Diagnosis not present

## 2024-02-09 ENCOUNTER — Ambulatory Visit: Attending: Internal Medicine | Admitting: Internal Medicine

## 2024-02-09 ENCOUNTER — Encounter: Payer: Self-pay | Admitting: Internal Medicine

## 2024-02-09 VITALS — BP 116/70 | HR 76 | Temp 97.3°F | Resp 16 | Ht 68.0 in | Wt 245.0 lb

## 2024-02-09 DIAGNOSIS — M0579 Rheumatoid arthritis with rheumatoid factor of multiple sites without organ or systems involvement: Secondary | ICD-10-CM | POA: Insufficient documentation

## 2024-02-09 DIAGNOSIS — L408 Other psoriasis: Secondary | ICD-10-CM | POA: Diagnosis not present

## 2024-02-09 DIAGNOSIS — Z79899 Other long term (current) drug therapy: Secondary | ICD-10-CM | POA: Diagnosis not present

## 2024-02-09 MED ORDER — MELOXICAM 15 MG PO TABS: 15.0000 mg | ORAL_TABLET | Freq: Every day | ORAL | 0 refills | Status: AC

## 2024-02-09 NOTE — Patient Instructions (Signed)
 SABRA

## 2024-02-10 ENCOUNTER — Telehealth: Payer: Self-pay | Admitting: Pharmacist

## 2024-02-10 DIAGNOSIS — M0579 Rheumatoid arthritis with rheumatoid factor of multiple sites without organ or systems involvement: Secondary | ICD-10-CM

## 2024-02-10 DIAGNOSIS — Z79899 Other long term (current) drug therapy: Secondary | ICD-10-CM

## 2024-02-10 DIAGNOSIS — L408 Other psoriasis: Secondary | ICD-10-CM

## 2024-02-10 NOTE — Telephone Encounter (Addendum)
 Submitted a Prior Authorization request to [facility] MEDICAID for ENBREL  via CoverMyMeds. Will update once we receive a response.  Key: [id number]  ----- Message from Sherry GORMAN Pennant sent at 02/09/2024  4:52 PM EST ----- Please start investigation into starting Enbrel  Sureclick for psoriatic arthritis (and possible seropositive RA overlap). Previously failed hyrimoz due to paradoxical rash flare. Thanks. ----- Message ----- From: Burl Francina HERO, CMA Sent: 02/09/2024   1:11 PM EST To: Rx Rheum/Pulm  Patient to start Enbrel  , consent form signed

## 2024-02-11 ENCOUNTER — Other Ambulatory Visit: Payer: Self-pay | Admitting: Pharmacist

## 2024-02-11 ENCOUNTER — Other Ambulatory Visit (HOSPITAL_COMMUNITY): Payer: Self-pay

## 2024-02-11 LAB — QUANTIFERON-TB GOLD PLUS
Mitogen-NIL: >10 [IU]/mL
NIL: 0.01 [IU]/mL
QuantiFERON-TB Gold Plus: NEGATIVE
TB1-NIL: 0 [IU]/mL
TB2-NIL: 0 [IU]/mL

## 2024-02-11 LAB — HEPATITIS B SURFACE ANTIGEN: Hepatitis B Surface Ag: NONREACTIVE

## 2024-02-11 LAB — C-REACTIVE PROTEIN: CRP: 9.1 mg/L — ABNORMAL HIGH (ref <8.0)

## 2024-02-11 LAB — HEPATITIS B CORE ANTIBODY, IGM: Hep B C IgM: NONREACTIVE

## 2024-02-11 LAB — SEDIMENTATION RATE: Sed Rate: 22 mm/h (ref 0–30)

## 2024-02-11 MED ORDER — ENBREL SURECLICK 50 MG/ML ~~LOC~~ SOAJ
50.0000 mg | SUBCUTANEOUS | 0 refills | Status: AC
Start: 2024-02-11 — End: ?

## 2024-02-11 NOTE — Telephone Encounter (Signed)
 Received notification from [facility] MEDICAID regarding a prior authorization for ENBREL . Authorization has been APPROVED from 02/10/2024 to 02/09/2025. Approval letter sent to scan center.  Per test claim, copay for 28 days supply is $4  Patient can fill through [facility]: [phone]   Authorization # [id number]  Patient can have Enbrel  new start visit today or otherwise just start Enbrel  SureClick at home.  Sherry Pennant, PharmD, MPH, BCPS, CPP Clinical Pharmacist [facility])

## 2024-02-11 NOTE — Telephone Encounter (Signed)
 Contacted patient regarding approval of her of Enbrel . Patient stated that she feels comfortable starting medication at home. Notified patient that [facility] will be reaching out to schedule payment and shipping information. Will send patient MyChart message with Enbrel  instructions for home administration.

## 2024-02-11 NOTE — Progress Notes (Signed)
 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 Patient had paradoxical rash to Humira. She feels comfortable starting Enbrel  at home (self-administers Mounjaro ).  Instructions about injection sent to patient via MyChart.  Sherry Pennant, PharmD, MPH, BCPS, CPP Clinical Pharmacist [facility])

## 2024-02-13 DIAGNOSIS — I872 Venous insufficiency (chronic) (peripheral): Secondary | ICD-10-CM | POA: Diagnosis not present

## 2024-02-13 DIAGNOSIS — R002 Palpitations: Secondary | ICD-10-CM | POA: Diagnosis not present

## 2024-02-13 DIAGNOSIS — R0602 Shortness of breath: Secondary | ICD-10-CM | POA: Diagnosis not present

## 2024-02-13 DIAGNOSIS — I10 Essential (primary) hypertension: Secondary | ICD-10-CM | POA: Diagnosis not present

## 2024-02-16 ENCOUNTER — Other Ambulatory Visit (HOSPITAL_COMMUNITY): Payer: Self-pay

## 2024-02-18 ENCOUNTER — Telehealth: Payer: Self-pay

## 2024-02-18 NOTE — Telephone Encounter (Signed)
 Spoke with a rep with member services who informs me that the pt would need to be on the line in order to discuss the process. Conference called the pt in--while an exception process to allow a 49-month override does exist, the issue is that the pt will require recertification for Medicaid during the period of time that she will be out of the [place]. Because of this, the possibility exists that pt could be without coverage during the period of time that the vacation override would cover.   The rep suggested that the pt contact DSS to see if she can either get an extension on her current certification or preemptively complete the certification process for next year. At that point she could contact member services once again and see about getting the override put in place.  Pt expressed that this is all more trouble than she would like to get into right now and stated that she would prefer to simply delay initiation of the medication until she returns from her trip at the end of February. I assured her that I would notify Dr. Jeannetta of her decision, but agreed that this is preferable to the alternative of starting the medication treatment while [place] is [place] due to possibility of adverse reactions or unwanted side-effects.  Will postpone triage encounter out to late February.

## 2024-02-18 NOTE — Telephone Encounter (Signed)
 Contacted the pharmacy helpdesk and spoke with representative [employer] who informs me that plan limitations for a vacation override cannot exceed a 28-day supply, however an exception may be able to be put in place by contacting member services. She advised that the patient may be required to do this on her own, having said that as the MD's office I may be able to do so on her behalf. She provided me with the number [phone].  She also stated that the only requirement to having the 28-day override put in place is that there is a rejection on file for the date of the request.  Ref#: [id number]

## 2024-02-20 NOTE — Telephone Encounter (Addendum)
 If we have 4 Enbrel  Sureclick samples, we can give her samples in addition to 4 pens that would be filled through Medicaid to get her started before and through trip. She can take these every 10 days if interested.  Sherry Pennant, PharmD, MPH, BCPS, CPP Clinical Pharmacist [facility])

## 2024-03-04 ENCOUNTER — Other Ambulatory Visit: Payer: Self-pay

## 2024-03-04 ENCOUNTER — Other Ambulatory Visit (HOSPITAL_COMMUNITY): Payer: Self-pay

## 2024-03-04 NOTE — Telephone Encounter (Signed)
 Spoke with patient about samples. She appreciates offer but would like to wait until [place] is [place] to [place] treatment. She gets back 05/10/24.

## 2024-03-17 ENCOUNTER — Other Ambulatory Visit (HOSPITAL_COMMUNITY): Payer: Self-pay

## 2024-04-26 ENCOUNTER — Ambulatory Visit: Admitting: Dermatology

## 2024-09-02 ENCOUNTER — Ambulatory Visit: Admitting: Dermatology
